# Patient Record
Sex: Female | Born: 1965 | Race: White | Hispanic: No | Marital: Married | Smoking: Former smoker
Health system: Southern US, Community
[De-identification: ages and names within clinical notes are randomized; demographics above are authoritative.]

## PROBLEM LIST (undated history)

## (undated) DIAGNOSIS — I10 Essential (primary) hypertension: Secondary | ICD-10-CM

---

## 2011-09-12 ENCOUNTER — Ambulatory Visit (INDEPENDENT_AMBULATORY_CARE_PROVIDER_SITE_OTHER): Payer: BC Managed Care – PPO | Admitting: Obstetrics & Gynecology

## 2011-09-12 ENCOUNTER — Encounter: Payer: Self-pay | Admitting: Obstetrics & Gynecology

## 2011-09-12 VITALS — BP 155/77 | HR 89 | Ht 64.0 in | Wt 136.0 lb

## 2011-09-12 DIAGNOSIS — Z124 Encounter for screening for malignant neoplasm of cervix: Secondary | ICD-10-CM

## 2011-09-12 DIAGNOSIS — Z0000 Encounter for general adult medical examination without abnormal findings: Secondary | ICD-10-CM

## 2011-09-12 DIAGNOSIS — Z803 Family history of malignant neoplasm of breast: Secondary | ICD-10-CM

## 2011-09-12 DIAGNOSIS — Z1151 Encounter for screening for human papillomavirus (HPV): Secondary | ICD-10-CM

## 2011-09-12 DIAGNOSIS — Z113 Encounter for screening for infections with a predominantly sexual mode of transmission: Secondary | ICD-10-CM

## 2011-09-12 DIAGNOSIS — Z01419 Encounter for gynecological examination (general) (routine) without abnormal findings: Secondary | ICD-10-CM

## 2011-09-12 NOTE — Patient Instructions (Signed)
Place premenopausal annual exam patient instructions here.  °

## 2011-09-12 NOTE — Progress Notes (Signed)
Yearly exam.  Has had Mirena in for 6 years now, she does need it removed and replaced, she is not currently sexually active.

## 2011-09-12 NOTE — Progress Notes (Signed)
  Subjective:     Teresa Lewis is a 46 y.o. female here for a routine exam.  Current complaints: none.  Personal health questionnaire reviewed: yes.   Gynecologic History Patient's last menstrual period was 08/22/2011. Contraception: IUD Last Pap: 2006. Results were: normal Last mammogram: never. Results were: n/a  Obstetric History OB History    Grav Para Term Preterm Abortions TAB SAB Ect Mult Living                   The following portions of the patient's history were reviewed and updated as appropriate: allergies, current medications, past family history, past medical history, past social history, past surgical history and problem list.  Review of Systems Pertinent items are noted in HPI.    Objective:    Vitals:  WNL General appearance: alert, cooperative and no distress Head: Normocephalic, without obvious abnormality, atraumatic Eyes: negative Throat: lips, mucosa, and tongue normal; teeth and gums normal Lungs: clear to auscultation bilaterally Breasts: normal appearance, no masses or tenderness, No nipple retraction or dimpling, No nipple discharge or bleeding Heart: regular rate and rhythm Abdomen: soft, non-tender; bowel sounds normal; no masses,  no organomegaly Pelvic: cervix normal in appearance, external genitalia normal, no adnexal masses or tenderness, no bladder tenderness, no cervical motion tenderness, perianal skin: no external genital warts noted, rectovaginal septum normal, urethra without abnormality or discharge, uterus normal size, shape, and consistency and vagina normal without discharge.  Uterus retroverted. Extremities: no edema, redness or tenderness in the calves or thighs Skin: no lesions or rash Lymph nodes: Axillary adenopathy: none       Assessment:    Healthy female exam.    Plan:    Education reviewed: safe sex/STD prevention, self breast exams and skin cancer screening. Contraception: IUD. Mammogram ordered. Follow up in: 2  weeks. IUD removed today.  Has been in 7- years.  Will come back in for IUD placement next week. PRev health care labs at next visit   Pt to ask mom about BRCA testing

## 2011-09-27 ENCOUNTER — Ambulatory Visit (INDEPENDENT_AMBULATORY_CARE_PROVIDER_SITE_OTHER): Payer: BC Managed Care – PPO | Admitting: Advanced Practice Midwife

## 2011-09-27 ENCOUNTER — Encounter: Payer: Self-pay | Admitting: Advanced Practice Midwife

## 2011-09-27 VITALS — BP 137/84 | HR 87 | Ht 64.0 in | Wt 136.0 lb

## 2011-09-27 DIAGNOSIS — Z13220 Encounter for screening for lipoid disorders: Secondary | ICD-10-CM

## 2011-09-27 DIAGNOSIS — Z01812 Encounter for preprocedural laboratory examination: Secondary | ICD-10-CM

## 2011-09-27 DIAGNOSIS — Z01419 Encounter for gynecological examination (general) (routine) without abnormal findings: Secondary | ICD-10-CM

## 2011-09-27 DIAGNOSIS — Z975 Presence of (intrauterine) contraceptive device: Secondary | ICD-10-CM

## 2011-09-27 DIAGNOSIS — Z30430 Encounter for insertion of intrauterine contraceptive device: Secondary | ICD-10-CM

## 2011-09-27 NOTE — Progress Notes (Signed)
Patient is here to have Mirena IUD inserted, she had one removed on 09/12/2011 due to the five year limit was up.

## 2011-09-28 DIAGNOSIS — Z975 Presence of (intrauterine) contraceptive device: Secondary | ICD-10-CM | POA: Insufficient documentation

## 2011-09-28 LAB — LIPID PANEL
HDL: 57 mg/dL (ref >39)
LDL Cholesterol: 112 mg/dL — ABNORMAL HIGH (ref 0–99)
Triglycerides: 62 mg/dL (ref <150)
VLDL: 12 mg/dL (ref 0–40)

## 2011-09-28 LAB — TSH: TSH: <0.008 u[IU]/mL — ABNORMAL LOW (ref 0.350–4.500)

## 2011-09-28 MED ORDER — LEVONORGESTREL 20 MCG/24HR IU IUD: 1.0000 | INTRAUTERINE_SYSTEM | Freq: Once | INTRAUTERINE | Status: DC

## 2011-09-28 NOTE — Progress Notes (Signed)
HPI: Teresa Lewis is a 46 y.o. year old [id number] female here for insertion of Mirena IUD. Had expired Mirena removed 09/12/11. No IC since.   Patient identified, informed consent performed, signed copy in chart, time out was performed.  Urine pregnancy test negative.  Bimanual exam reveled retroverted uterus. Speculum placed in the vagina.  Cervix visualized.  Cleaned with Betadine x 2.  Uterus sounded to 8cm.  Mirena IUD placed per manufacturer's recommendations.  Strings trimmed to 3 cm.   Patient given post procedure instructions and Mirena care card with expiration date.  Patient is asked to check IUD strings periodically and follow up in 4-6 weeks for IUD check.   [place], CNM 09/27/11 11:51

## 2011-09-28 NOTE — Patient Instructions (Addendum)
Intrauterine Device Insertion Care After Refer to this sheet in the next few weeks. These instructions provide you with information on caring for yourself after your procedure. Your caregiver may also give you more specific instructions. Your treatment has been planned according to current medical practices, but problems sometimes occur. Call your caregiver if you have any problems or questions after your procedure. HOME CARE INSTRUCTIONS   Only take over-the-counter or prescription medicines for pain, discomfort, or fever as directed by your caregiver. Do not use aspirin. This may increase bleeding.   Check your IUD to make sure it is in place before you resume sexual activity. You should be able to feel the strings. If you cannot feel the strings, something may be wrong. The IUD may have fallen out of the uterus, or the uterus may have been punctured (perforated) during placement. Also, if the strings are getting longer, it may mean that the IUD is being forced out of the uterus. You no longer have full protection from pregnancy if any of these problems occur.   You may resume sexual intercourse if you are not having problems with the IUD. The IUD is considered immediately effective.   You may resume normal activities.   Keep all follow-up appointments to be sure your IUD has remained in place. After the first exam, yearly exams are advised, unless you cannot feel the strings of your IUD.   Continue to check that the IUD is still in place by feeling for the strings after every menstrual period.  SEEK MEDICAL CARE IF:   You have bleeding that is heavier or lasts longer than a normal menstrual cycle.   You have a fever.   You have increasing cramps or abdominal pain not relieved with medicine.   You have abdominal pain that does not seem to be related to the same area of earlier cramping and pain.   You are lightheaded, unusually weak, or faint.   You have abnormal vaginal discharge or  smells.   You have pain during sexual intercourse.   You cannot feel the IUD strings, or the IUD string has gotten longer.   You feel the IUD at the opening of the cervix in the vagina.   You think you are pregnant, or you miss your menstrual period.   The IUD string is hurting your sex partner.  Document Released: 09/05/2010 Document Revised: 12/27/2010 Document Reviewed: 09/05/2010 Exit[employer] Patient Information 2012 [employer]. 

## 2011-10-01 ENCOUNTER — Other Ambulatory Visit: Payer: Self-pay | Admitting: Obstetrics & Gynecology

## 2011-10-01 ENCOUNTER — Ambulatory Visit (INDEPENDENT_AMBULATORY_CARE_PROVIDER_SITE_OTHER): Payer: BC Managed Care – PPO

## 2011-10-01 DIAGNOSIS — Z01419 Encounter for gynecological examination (general) (routine) without abnormal findings: Secondary | ICD-10-CM

## 2011-10-01 DIAGNOSIS — Z1231 Encounter for screening mammogram for malignant neoplasm of breast: Secondary | ICD-10-CM

## 2011-10-01 DIAGNOSIS — R928 Other abnormal and inconclusive findings on diagnostic imaging of breast: Secondary | ICD-10-CM

## 2011-10-01 DIAGNOSIS — Z803 Family history of malignant neoplasm of breast: Secondary | ICD-10-CM

## 2011-10-02 LAB — CBC WITH DIFFERENTIAL/PLATELET
Basophils Absolute: 0 10*3/uL (ref 0.0–0.1)
Basophils Relative: 1 % (ref 0–1)
Lymphocytes Relative: 32 % (ref 12–46)
MCHC: 32.9 g/dL (ref 30.0–36.0)
Neutro Abs: 3.6 10*3/uL (ref 1.7–7.7)
Platelets: 263 10*3/uL (ref 150–400)
RDW: 12.6 % (ref 11.5–15.5)
WBC: 6 10*3/uL (ref 4.0–10.5)

## 2011-10-04 ENCOUNTER — Other Ambulatory Visit: Payer: Self-pay | Admitting: Obstetrics & Gynecology

## 2011-10-04 DIAGNOSIS — R928 Other abnormal and inconclusive findings on diagnostic imaging of breast: Secondary | ICD-10-CM

## 2011-10-08 ENCOUNTER — Ambulatory Visit
Admission: RE | Admit: 2011-10-08 | Discharge: 2011-10-08 | Disposition: A | Discharge status: Home / Self Care | Payer: BC Managed Care – PPO | Source: Ambulatory Visit | Attending: Obstetrics & Gynecology | Admitting: Obstetrics & Gynecology

## 2011-10-08 DIAGNOSIS — R928 Other abnormal and inconclusive findings on diagnostic imaging of breast: Secondary | ICD-10-CM

## 2011-10-10 ENCOUNTER — Encounter: Payer: Self-pay | Admitting: Obstetrics & Gynecology

## 2011-10-16 ENCOUNTER — Other Ambulatory Visit: Payer: Self-pay | Admitting: Obstetrics & Gynecology

## 2011-10-16 DIAGNOSIS — E038 Other specified hypothyroidism: Secondary | ICD-10-CM

## 2011-10-18 ENCOUNTER — Other Ambulatory Visit (INDEPENDENT_AMBULATORY_CARE_PROVIDER_SITE_OTHER): Payer: BC Managed Care – PPO | Admitting: *Deleted

## 2011-10-18 DIAGNOSIS — E0590 Thyrotoxicosis, unspecified without thyrotoxic crisis or storm: Secondary | ICD-10-CM

## 2011-10-21 ENCOUNTER — Telehealth: Payer: Self-pay | Admitting: *Deleted

## 2011-10-21 NOTE — Telephone Encounter (Signed)
Pt referred to Dr Warden Fillers, endocrinologist for 11/22/11 @ 8:40 am.  Office phone is [phone].  Records faxed to his office.  Pt notified of appt

## 2011-11-01 ENCOUNTER — Ambulatory Visit: Payer: BC Managed Care – PPO | Admitting: Family

## 2011-11-01 DIAGNOSIS — Z09 Encounter for follow-up examination after completed treatment for conditions other than malignant neoplasm: Secondary | ICD-10-CM

## 2012-03-16 ENCOUNTER — Other Ambulatory Visit: Payer: Self-pay | Admitting: Obstetrics & Gynecology

## 2012-03-16 DIAGNOSIS — N6489 Other specified disorders of breast: Secondary | ICD-10-CM

## 2012-04-01 ENCOUNTER — Other Ambulatory Visit: Payer: Self-pay | Admitting: Obstetrics & Gynecology

## 2012-04-01 ENCOUNTER — Ambulatory Visit
Admission: RE | Admit: 2012-04-01 | Discharge: 2012-04-01 | Disposition: A | Discharge status: Home / Self Care | Payer: BC Managed Care – PPO | Source: Ambulatory Visit | Attending: Obstetrics & Gynecology | Admitting: Obstetrics & Gynecology

## 2012-04-01 DIAGNOSIS — N6489 Other specified disorders of breast: Secondary | ICD-10-CM

## 2012-04-01 HISTORY — PX: BREAST BIOPSY: SHX20

## 2012-04-02 ENCOUNTER — Encounter: Payer: Self-pay | Admitting: Obstetrics & Gynecology

## 2012-04-16 ENCOUNTER — Encounter: Payer: Self-pay | Admitting: Obstetrics & Gynecology

## 2013-02-08 ENCOUNTER — Other Ambulatory Visit: Payer: Self-pay

## 2013-02-08 ENCOUNTER — Other Ambulatory Visit: Payer: Self-pay | Admitting: Obstetrics & Gynecology

## 2013-02-08 DIAGNOSIS — N63 Unspecified lump in unspecified breast: Secondary | ICD-10-CM

## 2013-02-19 ENCOUNTER — Ambulatory Visit
Admission: RE | Admit: 2013-02-19 | Discharge: 2013-02-19 | Disposition: A | Discharge status: Home / Self Care | Payer: BC Managed Care – PPO | Source: Ambulatory Visit | Attending: Obstetrics & Gynecology | Admitting: Obstetrics & Gynecology

## 2013-02-19 DIAGNOSIS — N63 Unspecified lump in unspecified breast: Secondary | ICD-10-CM

## 2013-03-24 DIAGNOSIS — I10 Essential (primary) hypertension: Secondary | ICD-10-CM | POA: Insufficient documentation

## 2013-06-22 ENCOUNTER — Other Ambulatory Visit: Payer: Self-pay

## 2013-06-22 DIAGNOSIS — Z1231 Encounter for screening mammogram for malignant neoplasm of breast: Secondary | ICD-10-CM

## 2013-07-02 ENCOUNTER — Encounter (INDEPENDENT_AMBULATORY_CARE_PROVIDER_SITE_OTHER): Payer: Self-pay

## 2013-07-02 ENCOUNTER — Ambulatory Visit
Admission: RE | Admit: 2013-07-02 | Discharge: 2013-07-02 | Disposition: A | Discharge status: Home / Self Care | Payer: BC Managed Care – PPO | Source: Ambulatory Visit

## 2013-07-02 DIAGNOSIS — Z1231 Encounter for screening mammogram for malignant neoplasm of breast: Secondary | ICD-10-CM

## 2013-11-22 ENCOUNTER — Encounter: Payer: Self-pay | Admitting: Advanced Practice Midwife

## 2014-04-28 ENCOUNTER — Emergency Department (INDEPENDENT_AMBULATORY_CARE_PROVIDER_SITE_OTHER)
Admission: EM | Admit: 2014-04-28 | Discharge: 2014-04-28 | Disposition: A | Discharge status: Home / Self Care | Payer: BLUE CROSS/BLUE SHIELD | Source: Home / Self Care | Attending: Emergency Medicine | Admitting: Emergency Medicine

## 2014-04-28 ENCOUNTER — Encounter: Payer: Self-pay | Admitting: Emergency Medicine

## 2014-04-28 DIAGNOSIS — J1189 Influenza due to unidentified influenza virus with other manifestations: Secondary | ICD-10-CM | POA: Diagnosis not present

## 2014-04-28 DIAGNOSIS — J029 Acute pharyngitis, unspecified: Secondary | ICD-10-CM

## 2014-04-28 DIAGNOSIS — J111 Influenza due to unidentified influenza virus with other respiratory manifestations: Secondary | ICD-10-CM

## 2014-04-28 LAB — POCT RAPID STREP A (OFFICE): Rapid Strep A Screen: NEGATIVE

## 2014-04-28 MED ORDER — OSELTAMIVIR PHOSPHATE 75 MG PO CAPS: ORAL_CAPSULE | ORAL | Status: DC

## 2014-04-28 MED ORDER — BENZONATATE 200 MG PO CAPS: ORAL_CAPSULE | ORAL | Status: DC

## 2014-04-28 NOTE — ED Notes (Signed)
Sore throat, fever, cough, watery eyes runny nose, body aches, chills x 3 days

## 2014-04-29 LAB — STREP A DNA PROBE: GASP: NEGATIVE

## 2014-04-29 NOTE — ED Provider Notes (Signed)
CSN: [id number]     Arrival date & time 04/28/14  1140 History   First MD Initiated Contact with Patient 04/28/14 1148     Chief Complaint  Patient presents with  . Sore Throat   (Consider location/radiation/quality/duration/timing/severity/associated sxs/prior Treatment) HPI Sore throat, fever, cough, watery eyes runny nose, body aches, chills x 2 days FLU  HPI :  Fever to 102 with chills, sweats, myalgias, fatigue, headache. Symptoms are progressively worsening, despite trying OTC fever reducing medicine and rest and fluids. Has decreased appetite, but tolerating some liquids by mouth. No history of recent tick bite.  Review of Systems: Positive for fatigue, mild nasal congestion,  sore throat, mild swollen anterior neck glands, mild cough. Negative for acute vision changes, stiff neck, focal weakness, syncope, seizures, respiratory distress, vomiting, diarrhea, GU symptoms, new Rash.  Remainder of Review of Systems negative for acute change except as noted in the HPI.  History reviewed. No pertinent past medical history. History reviewed. No pertinent past surgical history. Family History  Problem Relation Age of Onset  . Cancer Mother [id number]    Breast Cancer  . Cancer Father     leukemia  . Diabetes Father   . Hypertension Father   . Clotting disorder Father   . Drug abuse Maternal Grandmother   . Drug abuse Maternal Grandfather   . Drug abuse Paternal Grandmother   . Drug abuse Paternal Grandfather    History  Substance Use Topics  . Smoking status: Former Games developermoker  . Smokeless tobacco: Never Used  . Alcohol Use: No   OB History    Gravida Para Term Preterm AB TAB SAB Ectopic Multiple Living   2 2 2       2      Review of Systems  Allergies  Review of patient's allergies indicates not on file.  Home Medications   Prior to Admission medications   Medication Sig Start Date End Date Taking? Authorizing Provider  guaiFENesin (MUCINEX) 600 MG 12 hr tablet Take by  mouth 2 (two) times daily.   Yes Historical Provider, MD  ibuprofen (ADVIL,MOTRIN) 200 MG tablet Take 200 mg by mouth every 6 (six) hours as needed.   Yes Historical Provider, MD  Pseudoephedrine-APAP-DM (DAYQUIL PO) Take by mouth.   Yes Historical Provider, MD  benzonatate (TESSALON) 200 MG capsule Take 1 every 8 hours as needed for cough. 04/28/14   Lajean Manesavid Massey, MD  levonorgestrel (MIRENA) 20 MCG/24HR IUD 1 Intra Uterine Device (1 each total) by Intrauterine route once. 09/28/11 09/27/12  Dorathy KinsmanVirginia Smith, CNM  oseltamivir (TAMIFLU) 75 MG capsule Starting today, take 1 capsule by mouth twice a day for 5 days. 04/28/14   Lajean Manesavid Massey, MD   BP 159/92 mmHg  Pulse 90  Temp(Src) 99.2 F (37.3 C) (Oral)  Ht 5\' 4"  (1.626 m)  Wt 143 lb (64.864 kg)  BMI 24.53 kg/m2  SpO2 97% Physical Exam  Constitutional: She is oriented to person, place, and time. She appears well-developed and well-nourished. She is cooperative.  Non-toxic appearance. No distress.  HENT:  Head: Normocephalic and atraumatic.  Right Ear: Tympanic membrane, external ear and ear canal normal.  Left Ear: Tympanic membrane, external ear and ear canal normal.  Nose: Nose normal. Right sinus exhibits no maxillary sinus tenderness and no frontal sinus tenderness. Left sinus exhibits no maxillary sinus tenderness and no frontal sinus tenderness.  Mouth/Throat: Mucous membranes are normal. Posterior oropharyngeal erythema present. No oropharyngeal exudate or posterior oropharyngeal edema.  Eyes: Conjunctivae are normal. No  scleral icterus.  Neck: Neck supple.  Cardiovascular: Normal rate, regular rhythm and normal heart sounds.   No murmur heard. Pulmonary/Chest: Effort normal and breath sounds normal. No stridor. No respiratory distress. She has no wheezes. She has no rales.  Musculoskeletal: She exhibits no edema.  Lymphadenopathy:    She has cervical adenopathy.       Right cervical: Superficial cervical adenopathy present. No deep cervical  and no posterior cervical adenopathy present.      Left cervical: Superficial cervical adenopathy present. No deep cervical and no posterior cervical adenopathy present.  Neurological: She is alert and oriented to person, place, and time.  Skin: Skin is warm and dry.  Psychiatric: She has a normal mood and affect.  Nursing note and vitals reviewed.   ED Course  Procedures (including critical care time) Labs Review Labs Reviewed  STREP A DNA PROBE   Narrative:    Performed at:  [facility]                [address]                [place], [place] [address]  POCT RAPID STREP A (OFFICE)   Results for orders placed or performed during the hospital encounter of 04/28/14  Strep A DNA probe  Result Value Ref Range   GASP NEGATIVE   POCT rapid strep A  Result Value Ref Range   Rapid Strep A Screen Negative Negative     Imaging Review No results found.   MDM   1. Influenza with respiratory manifestations   2. Acute pharyngitis, unspecified pharyngitis type    Treatment options discussed, as well as risks, benefits, alternatives. Patient voiced understanding and agreement with the following plans: Discharge Medication List as of 04/28/2014 12:40 PM    START taking these medications   Details  benzonatate (TESSALON) 200 MG capsule Take 1 every 8 hours as needed for cough., Normal    oseltamivir (TAMIFLU) 75 MG capsule Starting today, take 1 capsule by mouth twice a day for 5 days., Normal       other symptomatic care discussed Follow-up with your primary care doctor in 5-7 days if not improving, or sooner if symptoms become worse. Precautions discussed. Red flags discussed. Questions invited and answered. Patient voiced understanding and agreement.     Lajean Manes, MD 04/29/14 [id number]

## 2014-05-31 ENCOUNTER — Other Ambulatory Visit: Payer: Self-pay | Admitting: Obstetrics & Gynecology

## 2014-05-31 DIAGNOSIS — Z1231 Encounter for screening mammogram for malignant neoplasm of breast: Secondary | ICD-10-CM

## 2014-07-06 ENCOUNTER — Ambulatory Visit: Payer: BLUE CROSS/BLUE SHIELD

## 2015-02-10 ENCOUNTER — Ambulatory Visit: Payer: BLUE CROSS/BLUE SHIELD

## 2015-02-21 ENCOUNTER — Ambulatory Visit
Admission: RE | Admit: 2015-02-21 | Discharge: 2015-02-21 | Disposition: A | Discharge status: Home / Self Care | Payer: BLUE CROSS/BLUE SHIELD | Source: Ambulatory Visit | Attending: Obstetrics & Gynecology | Admitting: Obstetrics & Gynecology

## 2015-02-21 DIAGNOSIS — Z1231 Encounter for screening mammogram for malignant neoplasm of breast: Secondary | ICD-10-CM

## 2015-04-03 ENCOUNTER — Ambulatory Visit (INDEPENDENT_AMBULATORY_CARE_PROVIDER_SITE_OTHER): Payer: BLUE CROSS/BLUE SHIELD | Admitting: Obstetrics & Gynecology

## 2015-04-03 ENCOUNTER — Encounter: Payer: Self-pay | Admitting: Obstetrics & Gynecology

## 2015-04-03 VITALS — BP 144/81 | HR 78 | Resp 16 | Ht 64.0 in | Wt 137.0 lb

## 2015-04-03 DIAGNOSIS — N63 Unspecified lump in unspecified breast: Secondary | ICD-10-CM | POA: Insufficient documentation

## 2015-04-03 DIAGNOSIS — Z124 Encounter for screening for malignant neoplasm of cervix: Secondary | ICD-10-CM

## 2015-04-03 DIAGNOSIS — N644 Mastodynia: Secondary | ICD-10-CM | POA: Diagnosis not present

## 2015-04-03 DIAGNOSIS — Z1151 Encounter for screening for human papillomavirus (HPV): Secondary | ICD-10-CM

## 2015-04-03 DIAGNOSIS — Z01419 Encounter for gynecological examination (general) (routine) without abnormal findings: Secondary | ICD-10-CM | POA: Diagnosis not present

## 2015-04-03 DIAGNOSIS — N6012 Diffuse cystic mastopathy of left breast: Secondary | ICD-10-CM | POA: Diagnosis not present

## 2015-04-03 DIAGNOSIS — Z803 Family history of malignant neoplasm of breast: Secondary | ICD-10-CM

## 2015-04-03 NOTE — Addendum Note (Signed)
Addended by: Granville LewisLARK, LORA L on: 04/03/2015 04:32 PM   Modules accepted: Orders

## 2015-04-03 NOTE — Progress Notes (Signed)
  Subjective:     Teresa Lewis is a 50 y.o. female here for a routine exam.  Current complaints: left breast pain that starts at 3'0clock and shoots laterally.  Occurred for past 6 months.  Had nml mammogram 2 months ago.  Wants BRCA testing (my risk).  Amenorrhea with IUD and happy..   Gynecologic History No LMP recorded. Patient is not currently having periods (Reason: IUD). Contraception: IUD Last Pap: 204. Results were: normal Last mammogram: 2017. Results were: normal  Obstetric History OB History  Gravida Para Term Preterm AB SAB TAB Ectopic Multiple Living  '2 2 2       2    '$ # Outcome Date GA Lbr Len/2nd Weight Sex Delivery Anes PTL Lv  2 Term      Vag-Spont     1 Term      Vag-Spont          The following portions of the patient's history were reviewed and updated as appropriate: allergies, current medications, past family history, past medical history, past social history, past surgical history and problem list.  Review of Systems Pertinent items noted in HPI and remainder of comprehensive ROS otherwise negative.    Objective:      Filed Vitals:   04/03/15 1418  BP: 144/81  Pulse: 78  Resp: 16  Height: '5\' 4"'$  (1.626 m)  Weight: 137 lb (62.143 kg)   Vitals:  WNL General appearance: alert, cooperative and no distress  HEENT: Normocephalic, without obvious abnormality, atraumatic Eyes: negative Throat: lips, mucosa, and tongue normal; teeth and gums normal  Respiratory: Clear to auscultation bilaterally  CV: Regular rate and rhythm  Breasts:  Normal appearance, no masses or tenderness, no nipple retraction or dimpling; pain just lateral to nipple at 3 o'clock, no mass,   GI: Soft, non-tender; bowel sounds normal; no masses,  no organomegaly  GU: External Genitalia:  Tanner V, no lesion Urethra:  No prolapse   Vagina: Pink, normal rugae, no blood or discharge  Cervix: No CMT, no lesion  Uterus:  Normal size and contour, non tender  Adnexa: Normal, no  masses, non tender  Musculoskeletal: No edema, redness or tenderness in the calves or thighs  Skin: No lesions or rash  Lymphatic: Axillary adenopathy: none    Psychiatric: Normal mood and behavior          Assessment:    Healthy female exam.   Left breast pain x 6 months Mother and other relative with breast cancer   Plan:    Contraception: IUD. Mammogram ordered. Follow up in: 1 year. pap  My risk Colonoscopy at 65 IUD removal in 2018; wants reinsertion.

## 2015-04-05 LAB — CYTOLOGY - PAP

## 2015-04-07 ENCOUNTER — Encounter: Payer: Self-pay | Admitting: Obstetrics & Gynecology

## 2015-04-07 ENCOUNTER — Telehealth: Payer: Self-pay | Admitting: *Deleted

## 2015-04-07 DIAGNOSIS — IMO0002 Reserved for concepts with insufficient information to code with codable children: Secondary | ICD-10-CM | POA: Insufficient documentation

## 2015-04-07 NOTE — Telephone Encounter (Signed)
Pt notified of abnormal pap results and appt made with Dr Penne LashLeggett for Colposcopy

## 2015-04-07 NOTE — Telephone Encounter (Signed)
-----   Message from Teresa DukesKelly H Leggett, MD sent at 04/07/2015  4:30 AM EDT ----- ASCUS with HPV positive.  Pt needs to have colposcopy scheduled.   Remind pt to sign up for my chart

## 2015-04-10 ENCOUNTER — Ambulatory Visit
Admission: RE | Admit: 2015-04-10 | Discharge: 2015-04-10 | Disposition: A | Discharge status: Home / Self Care | Payer: BLUE CROSS/BLUE SHIELD | Source: Ambulatory Visit | Attending: Obstetrics & Gynecology | Admitting: Obstetrics & Gynecology

## 2015-04-10 DIAGNOSIS — N644 Mastodynia: Secondary | ICD-10-CM

## 2015-04-12 ENCOUNTER — Ambulatory Visit (INDEPENDENT_AMBULATORY_CARE_PROVIDER_SITE_OTHER): Payer: BLUE CROSS/BLUE SHIELD | Admitting: Obstetrics & Gynecology

## 2015-04-12 ENCOUNTER — Encounter: Payer: Self-pay | Admitting: Obstetrics & Gynecology

## 2015-04-12 VITALS — BP 122/66 | HR 92 | Resp 16 | Ht 64.0 in | Wt 138.0 lb

## 2015-04-12 DIAGNOSIS — R87810 Cervical high risk human papillomavirus (HPV) DNA test positive: Secondary | ICD-10-CM

## 2015-04-12 DIAGNOSIS — R87610 Atypical squamous cells of undetermined significance on cytologic smear of cervix (ASC-US): Secondary | ICD-10-CM

## 2015-04-12 DIAGNOSIS — Z01812 Encounter for preprocedural laboratory examination: Secondary | ICD-10-CM

## 2015-04-12 DIAGNOSIS — IMO0002 Reserved for concepts with insufficient information to code with codable children: Secondary | ICD-10-CM

## 2015-04-12 LAB — POCT URINE PREGNANCY: Preg Test, Ur: NEGATIVE

## 2015-04-12 NOTE — Progress Notes (Signed)
  Colposcopy Procedure Note  Indications: Pap smear 2 weeks ago showed: ASCUS with POSITIVE high risk HPV. The prior pap showed ASCUS with POSITIVE high risk HPV.  Prior cervical/vaginal disease: none. Prior cervical treatment: no treatment.  Pregnancy test negative.   IUD strings present at end of procedure.  Procedure Details  The risks and benefits of the procedure and Written informed consent obtained.  Speculum placed in vagina and excellent visualization of cervix achieved, cervix swabbed x 3 with acetic acid solution.  Findings: Cervix: acetowhite lesion(s) noted at 9 o'clock just inside cervical os; cervix swabbed with Lugol's solution, endocervical speculum placed, endocervical lesion noted at 9 o'clock, endocervical curettage performed, cervical biopsies taken at 9 o'clock, specimen labelled and sent to pathology and hemostasis achieved with Monsel's solution. Vaginal inspection: vaginal colposcopy not performed. Vulvar colposcopy: vulvar colposcopy not performed.  Specimens: Biopsy at 9 o'clock and ECC  Complications: none.  Plan: Specimens labelled and sent to Pathology. Will base further treatment on Pathology findings.

## 2015-04-13 ENCOUNTER — Encounter: Payer: Self-pay | Admitting: *Deleted

## 2015-04-13 ENCOUNTER — Telehealth: Payer: Self-pay | Admitting: *Deleted

## 2015-04-13 NOTE — Telephone Encounter (Signed)
Copy of My Risk results mailed to pt and copy scanned into chart

## 2015-04-17 ENCOUNTER — Telehealth: Payer: Self-pay | Admitting: *Deleted

## 2015-04-17 NOTE — Telephone Encounter (Signed)
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 RN to call patient

## 2015-04-17 NOTE — Telephone Encounter (Signed)
Pt notified via phone of bx results and will recheck in 1 year per Dr Penne LashLeggett.

## 2015-05-04 ENCOUNTER — Telehealth: Payer: Self-pay | Admitting: *Deleted

## 2015-05-11 NOTE — Telephone Encounter (Signed)
See above note

## 2015-05-15 ENCOUNTER — Telehealth: Payer: Self-pay | Admitting: *Deleted

## 2015-05-15 NOTE — Telephone Encounter (Signed)
-----  Message from Guss Bunde, MD sent at 04/25/2015  1:43 PM EDT ----- Can you check with her and see if she called so I can document her result. ----- Message -----    From: Asencion Islam, RN    Sent: 04/25/2015  10:51 AM      To: Guss Bunde, MD  Yes, There is a number on bottom of report that pt can call.  i believe I let pt know that. ----- Message -----    From: Guss Bunde, MD    Sent: 04/25/2015  10:30 AM      To: Asencion Islam, RN  Does [facility] do counseling over the phone for these variants (my risk)

## 2015-05-15 NOTE — Telephone Encounter (Signed)
LM on voicmeail to call office as a f/u on My Risk results.  Looking to see if pt called [facility] to speak with a genetic counselor concerning the variant that was noted on her results.  LM for patient to call with updates on that information.

## 2016-01-01 ENCOUNTER — Ambulatory Visit (INDEPENDENT_AMBULATORY_CARE_PROVIDER_SITE_OTHER): Payer: BLUE CROSS/BLUE SHIELD | Admitting: Obstetrics & Gynecology

## 2016-01-01 ENCOUNTER — Encounter: Payer: Self-pay | Admitting: Obstetrics & Gynecology

## 2016-01-01 VITALS — BP 141/73 | HR 81 | Ht 64.0 in | Wt 155.0 lb

## 2016-01-01 DIAGNOSIS — B9689 Other specified bacterial agents as the cause of diseases classified elsewhere: Secondary | ICD-10-CM

## 2016-01-01 DIAGNOSIS — N760 Acute vaginitis: Secondary | ICD-10-CM

## 2016-01-01 MED ORDER — METRONIDAZOLE 500 MG PO TABS: 500.0000 mg | ORAL_TABLET | Freq: Two times a day (BID) | ORAL | 0 refills | Status: DC

## 2016-01-01 NOTE — Progress Notes (Signed)
   Subjective:    Patient ID: Teresa Lewis, female    DOB: 12/23/1965, 50 y.o.   MRN: [id number]  HPI 50 yo engaged lady here with the complaint of a month's h/o post coital spotting. She also reports an occasional "abscess" of her perineum but doesn't appreciate it today.   Review of Systems She doesn't believe that testing for CT/GC is necessary.    Objective:   Physical Exam [id number] Breathing, conversing, and ambulating normally Vaginal discharge c/w BV IUD strings seen No abnormalities seen of perineum       Assessment & Plan:  Probable BV- wet prep sent, treat with flagyl I suspect that the PCB is due to the age of her Mirena (4 years). Reassurance given

## 2016-01-02 ENCOUNTER — Telehealth: Payer: Self-pay | Admitting: *Deleted

## 2016-01-02 LAB — WET PREP FOR TRICH, YEAST, CLUE
Trich, Wet Prep: NONE SEEN
Yeast Wet Prep HPF POC: NONE SEEN

## 2016-01-02 NOTE — Telephone Encounter (Signed)
Pt notified of BV on wet prep.  Dr Marice Potterove treat patient with Flagyl

## 2016-01-10 ENCOUNTER — Other Ambulatory Visit: Payer: Self-pay | Admitting: Obstetrics & Gynecology

## 2016-01-10 DIAGNOSIS — B9689 Other specified bacterial agents as the cause of diseases classified elsewhere: Secondary | ICD-10-CM

## 2016-01-10 DIAGNOSIS — N760 Acute vaginitis: Principal | ICD-10-CM

## 2016-01-17 ENCOUNTER — Telehealth: Payer: Self-pay | Admitting: *Deleted

## 2016-01-17 DIAGNOSIS — N760 Acute vaginitis: Principal | ICD-10-CM

## 2016-01-17 DIAGNOSIS — B9689 Other specified bacterial agents as the cause of diseases classified elsewhere: Secondary | ICD-10-CM

## 2016-01-17 MED ORDER — CLINDAMYCIN PHOSPHATE (1 DOSE) 2 % VA CREA: TOPICAL_CREAM | VAGINAL | 1 refills | Status: DC

## 2016-01-17 NOTE — Telephone Encounter (Signed)
Pt called stating that she has finished her Flagyl yet doesn't feel like it has cleared up.  RX switched to Clindamycin cream

## 2016-02-19 ENCOUNTER — Other Ambulatory Visit: Payer: Self-pay | Admitting: Obstetrics & Gynecology

## 2016-02-19 DIAGNOSIS — Z1231 Encounter for screening mammogram for malignant neoplasm of breast: Secondary | ICD-10-CM

## 2016-02-29 ENCOUNTER — Telehealth: Payer: Self-pay | Admitting: *Deleted

## 2016-02-29 DIAGNOSIS — N760 Acute vaginitis: Principal | ICD-10-CM

## 2016-02-29 DIAGNOSIS — B9689 Other specified bacterial agents as the cause of diseases classified elsewhere: Secondary | ICD-10-CM

## 2016-02-29 MED ORDER — METRONIDAZOLE 500 MG PO TABS: 500.0000 mg | ORAL_TABLET | Freq: Two times a day (BID) | ORAL | 0 refills | Status: DC

## 2016-02-29 MED ORDER — NONFORMULARY OR COMPOUNDED ITEM: 1.0000 | Freq: Every day | 0 refills | Status: DC

## 2016-02-29 NOTE — Telephone Encounter (Signed)
Pt called stating that her feels like her BV is back.  Spoke with Dr Marice Potterove who recommended a RF on Falgyl and start Boric Acid capsules for 21 days after she finishes the Flagyl.  [facility]

## 2016-03-04 ENCOUNTER — Ambulatory Visit: Payer: BLUE CROSS/BLUE SHIELD

## 2016-03-06 ENCOUNTER — Ambulatory Visit
Admission: RE | Admit: 2016-03-06 | Discharge: 2016-03-06 | Disposition: A | Discharge status: Home / Self Care | Payer: BLUE CROSS/BLUE SHIELD | Source: Ambulatory Visit | Attending: Obstetrics & Gynecology | Admitting: Obstetrics & Gynecology

## 2016-03-06 DIAGNOSIS — Z1231 Encounter for screening mammogram for malignant neoplasm of breast: Secondary | ICD-10-CM

## 2016-04-01 ENCOUNTER — Ambulatory Visit: Payer: BLUE CROSS/BLUE SHIELD | Admitting: Obstetrics & Gynecology

## 2016-04-10 ENCOUNTER — Encounter: Payer: Self-pay | Admitting: Obstetrics & Gynecology

## 2016-04-10 ENCOUNTER — Ambulatory Visit (INDEPENDENT_AMBULATORY_CARE_PROVIDER_SITE_OTHER): Payer: BLUE CROSS/BLUE SHIELD | Admitting: Obstetrics & Gynecology

## 2016-04-10 VITALS — BP 136/76 | HR 96 | Ht 64.0 in | Wt 155.0 lb

## 2016-04-10 DIAGNOSIS — Z0000 Encounter for general adult medical examination without abnormal findings: Secondary | ICD-10-CM

## 2016-04-10 DIAGNOSIS — Z30433 Encounter for removal and reinsertion of intrauterine contraceptive device: Secondary | ICD-10-CM

## 2016-04-10 DIAGNOSIS — Z01419 Encounter for gynecological examination (general) (routine) without abnormal findings: Secondary | ICD-10-CM

## 2016-04-10 DIAGNOSIS — Z01812 Encounter for preprocedural laboratory examination: Secondary | ICD-10-CM

## 2016-04-10 DIAGNOSIS — R350 Frequency of micturition: Secondary | ICD-10-CM

## 2016-04-10 LAB — LIPID PANEL
CHOL/HDL RATIO: 2.5 ratio (ref <5.0)
CHOLESTEROL: 209 mg/dL — AB (ref <200)
HDL: 82 mg/dL (ref >50)
LDL Cholesterol: 118 mg/dL — ABNORMAL HIGH (ref <100)
Triglycerides: 43 mg/dL (ref <150)
VLDL: 9 mg/dL (ref <30)

## 2016-04-10 LAB — COMPREHENSIVE METABOLIC PANEL
ALBUMIN: 4.5 g/dL (ref 3.6–5.1)
ALT: 21 U/L (ref 6–29)
AST: 19 U/L (ref 10–35)
Alkaline Phosphatase: 52 U/L (ref 33–115)
BILIRUBIN TOTAL: 0.5 mg/dL (ref 0.2–1.2)
BUN: 20 mg/dL (ref 7–25)
CO2: 29 mmol/L (ref 20–31)
CREATININE: 0.86 mg/dL (ref 0.50–1.10)
Calcium: 9.6 mg/dL (ref 8.6–10.2)
Chloride: 101 mmol/L (ref 98–110)
Glucose, Bld: 96 mg/dL (ref 65–99)
Potassium: 4 mmol/L (ref 3.5–5.3)
Sodium: 138 mmol/L (ref 135–146)
Total Protein: 6.6 g/dL (ref 6.1–8.1)

## 2016-04-10 LAB — CBC
HCT: 45.1 % — ABNORMAL HIGH (ref 35.0–45.0)
Hemoglobin: 15.2 g/dL (ref 11.7–15.5)
MCH: 33.6 pg — ABNORMAL HIGH (ref 27.0–33.0)
MCHC: 33.7 g/dL (ref 32.0–36.0)
MCV: 99.6 fL (ref 80.0–100.0)
MPV: 9.4 fL (ref 7.5–12.5)
PLATELETS: 325 10*3/uL (ref 140–400)
RBC: 4.53 MIL/uL (ref 3.80–5.10)
RDW: 12.7 % (ref 11.0–15.0)
WBC: 6.1 10*3/uL (ref 3.8–10.8)

## 2016-04-10 LAB — FOLLICLE STIMULATING HORMONE: FSH: 100.7 m[IU]/mL

## 2016-04-10 LAB — TSH: TSH: 2.56 m[IU]/L

## 2016-04-10 MED ORDER — LEVONORGESTREL 20 MCG/24HR IU IUD: INTRAUTERINE_SYSTEM | Freq: Once | INTRAUTERINE | Status: DC

## 2016-04-10 NOTE — Progress Notes (Signed)
Progress Notes      Subjective:     Teresa Lewis is a 51 y.o. female here for a routine exam with pap.  Current complaints: "straining sensation with urination and feels that her infection is back." Had nml mammogram 02/2016, marker was placed last year to monitor a pain she was experiencing. had BRCA testing (my risk), came back with variant uncertainty significance.  Amenorrhea with IUD,would like it replaced.    Gynecologic History No LMP recorded. Patient is not currently having periods (Reason: IUD). Contraception: IUD Last Pap: 12/17. Results were: positive for BV on wet prep, treatment with Flagyl, Clindamycin cream and boric acid capsules.  Last mammogram: 2018. Results were: normal  Obstetric History                      OB History  Gravida Para Term Preterm AB SAB TAB Ectopic Multiple Living  '2 2 2       2    '$ # Outcome Date GA Lbr Len/2nd Weight Sex Delivery Anes PTL Lv  2 Term      Vag-Spont     1 Term      Vag-Spont          The following portions of the patient's history were reviewed and updated as appropriate: allergies, current medications, past family history, past medical history, past social history, past surgical history and problem list.  Review of Systems Pertinent items noted in HPI and remainder of comprehensive ROS otherwise negative.    Objective:      Filed Vitals:   04/10/16 9:01  BP: 136/76  Pulse: 96  Resp: 16  Height: '5\' 4"'$  (1.626 m)  Weight: 155 lb (62.143 kg)   Vitals:  WNL General appearance: alert, cooperative and no distress  HEENT: Normocephalic, without obvious abnormality, atraumatic Eyes: negative Throat: lips, mucosa, and tongue normal; teeth and gums normal  Respiratory: Clear to auscultation bilaterally  CV: Regular rate and rhythm  Breasts:  Normal appearance, no masses or tenderness, no nipple retraction or dimpling, or pain. Scar noted at 2 o'clock from mammogram marker placement  in 2017.   GI: Soft, non-tender; bowel sounds normal; no masses,  no organomegaly  GU: External Genitalia:  Tanner V, no lesion Urethra:  No prolapse   Vagina: Pink, normal rugae, no blood or discharge  Cervix: No CMT, no lesion, IUD strings present  Uterus:  Normal size and contour, non tender  Adnexa: Normal, no masses, non tender  Musculoskeletal: No edema, redness or tenderness in the calves or thighs  Skin: No lesions or rash  Lymphatic: Axillary adenopathy: none    Psychiatric: Normal mood and behavior          Assessment:  Healthy female exam. Pap performed, pt concern for BV return Aptima sent for vaginitis.  IUD in place with strings visualized on pap exam. Pt desires STD testing.  Cytology-Pap  Labs: CMP Hepatitis B surface antigen Hepatitis C antibody HIV antibody with reflex Lipid Panel RPR Vitamin D TSH FSH Urine pregnancy    Plan:   Contraception: IUD. Follow up in: 1 month. Pap results will be called to the patient and placed in my chart. Advise patient to set up my chart. So, that she can see results and contact office, as need.  My risk-advised to contact the company to have further genetic counseling about variant uncertainty of significance discussion. As Mother and another relative had breast cancer.   Colonoscopy at 52 education provided. Will  see PCP providers with [facility] to set this up. IUD removal and replacement is pending her [place] levels, and if she needs contraception therapy versus removing IUD and monitoring for periods if [place] levels indicate menopause. Educated on calcium and vitamin D intake. Provided pamphlet on food sources and daily intake recommendations.

## 2016-04-11 ENCOUNTER — Telehealth: Payer: Self-pay | Admitting: *Deleted

## 2016-04-11 DIAGNOSIS — N390 Urinary tract infection, site not specified: Secondary | ICD-10-CM

## 2016-04-11 LAB — HEPATITIS C ANTIBODY: HCV Ab: NEGATIVE

## 2016-04-11 LAB — CYTOLOGY - PAP
Diagnosis: NEGATIVE
HPV: NOT DETECTED

## 2016-04-11 LAB — HEPATITIS B SURFACE ANTIGEN: Hepatitis B Surface Ag: NEGATIVE

## 2016-04-11 LAB — RPR

## 2016-04-11 LAB — VITAMIN D 25 HYDROXY (VIT D DEFICIENCY, FRACTURES): VIT D 25 HYDROXY: 45 ng/mL (ref 30–100)

## 2016-04-11 LAB — POCT URINE PREGNANCY: Preg Test, Ur: NEGATIVE

## 2016-04-11 MED ORDER — SULFAMETHOXAZOLE-TRIMETHOPRIM 800-160 MG PO TABS: 1.0000 | ORAL_TABLET | Freq: Two times a day (BID) | ORAL | 0 refills | Status: DC

## 2016-04-11 NOTE — Telephone Encounter (Signed)
See notes

## 2016-04-11 NOTE — Telephone Encounter (Signed)
Pt notified of urine culture results and RX for Bactrim was sent to her pharmacy per Dr Penne LashLeggett.  Her FSH is elevated and pt is in menopause.  She can have the IUD removed and not replaced.  Her other labs were reviewed with pt and they were released to my chart.

## 2016-04-12 LAB — CERVICOVAGINAL ANCILLARY ONLY
Bacterial vaginitis: NEGATIVE
Candida vaginitis: NEGATIVE
Chlamydia: NEGATIVE
NEISSERIA GONORRHEA: NEGATIVE
Trichomonas: NEGATIVE

## 2016-04-12 LAB — CULTURE, URINE COMPREHENSIVE

## 2016-04-13 ENCOUNTER — Other Ambulatory Visit: Payer: Self-pay | Admitting: Obstetrics & Gynecology

## 2016-04-13 LAB — HIV ANTIBODY (ROUTINE TESTING W REFLEX): HIV 1&2 Ab, 4th Generation: NONREACTIVE

## 2016-04-13 MED ORDER — CIPROFLOXACIN HCL 500 MG PO TABS: 500.0000 mg | ORAL_TABLET | Freq: Two times a day (BID) | ORAL | 0 refills | Status: DC

## 2016-04-13 NOTE — Progress Notes (Signed)
UTI not sensitive to Bactrim.  [facility]

## 2016-04-15 ENCOUNTER — Telehealth: Payer: Self-pay | Admitting: *Deleted

## 2016-04-15 NOTE — Telephone Encounter (Signed)
-----   Message from Lesly DukesKelly H Leggett, MD sent at 04/12/2016 12:18 PM EDT ----- Pt was prescribed bactrim yesterday

## 2016-04-30 ENCOUNTER — Encounter: Payer: Self-pay | Admitting: *Deleted

## 2016-07-12 ENCOUNTER — Emergency Department (INDEPENDENT_AMBULATORY_CARE_PROVIDER_SITE_OTHER): Payer: BLUE CROSS/BLUE SHIELD

## 2016-07-12 ENCOUNTER — Emergency Department
Admission: EM | Admit: 2016-07-12 | Discharge: 2016-07-12 | Disposition: A | Discharge status: Home / Self Care | Payer: BLUE CROSS/BLUE SHIELD | Source: Home / Self Care | Attending: Family Medicine | Admitting: Family Medicine

## 2016-07-12 ENCOUNTER — Encounter: Payer: Self-pay | Admitting: Emergency Medicine

## 2016-07-12 DIAGNOSIS — S93401A Sprain of unspecified ligament of right ankle, initial encounter: Secondary | ICD-10-CM | POA: Diagnosis not present

## 2016-07-12 DIAGNOSIS — M25571 Pain in right ankle and joints of right foot: Secondary | ICD-10-CM | POA: Diagnosis not present

## 2016-07-12 NOTE — ED Triage Notes (Signed)
Rt ankle injury yesterday, twisted while working out, Bruised, swollen, painful.

## 2016-07-12 NOTE — ED Provider Notes (Signed)
[facility]    CSN: [id number] Arrival date & time: 07/12/16  4098     History   Chief Complaint Chief Complaint  Patient presents with  . Ankle Injury    HPI Teresa Lewis is a 51 y.o. female.   Patient states that she inverted her right ankle yesterday while exercising, and has had persistent pain/swelling over her lateral malleolus.   The history is provided by the patient.  Ankle Injury  This is a new problem. The current episode started yesterday. The problem occurs constantly. The problem has not changed since onset.The symptoms are aggravated by walking and standing. The symptoms are relieved by position. Treatments tried: ice pack and ibuprofen. The treatment provided mild relief.    History reviewed. No pertinent past medical history.  Patient Active Problem List   Diagnosis Date Noted  . ASCUS with positive high risk HPV 04/07/2015  . Painful lumpy breasts 04/03/2015  . Essential (primary) hypertension 03/24/2013  . IUD (intrauterine device) in place 09/28/2011  . Family history of breast cancer in mother 09/12/2011    Past Surgical History:  Procedure Laterality Date  . BREAST BIOPSY Left 04/01/2012   [place] core  benign    OB History    Gravida Para Term Preterm AB Living   2 2 2     2    SAB TAB Ectopic Multiple Live Births                   Home Medications    Prior to Admission medications   Medication Sig Start Date End Date Taking? Authorizing Provider  hydrochlorothiazide (MICROZIDE) 12.5 MG capsule TAKE ONE CAPSULE (12.5 MG TOTAL) BY MOUTH EVERY MORNING. 12/24/15   [provider]  ibuprofen (ADVIL,MOTRIN) 200 MG tablet Take 200 mg by mouth every 6 (six) hours as needed.    [provider]  levonorgestrel (MIRENA) 20 MCG/24HR IUD 1 each by Intrauterine route once.    [provider]    Family History Family History  Problem Relation Age of Onset  . Cancer Mother 70       Breast Cancer  . Breast  cancer Mother   . Cancer Father        leukemia  . Diabetes Father   . Hypertension Father   . Clotting disorder Father   . Drug abuse Maternal Grandmother   . Drug abuse Maternal Grandfather   . Drug abuse Paternal Grandmother   . Drug abuse Paternal Grandfather     Social History Social History  Substance Use Topics  . Smoking status: Former Games developer  . Smokeless tobacco: Never Used  . Alcohol use No     Allergies   Patient has no known allergies.   Review of Systems Review of Systems  All other systems reviewed and are negative.    Physical Exam Triage Vital Signs ED Triage Vitals  Enc Vitals Group     BP 07/12/16 0947 (!) 156/81     Pulse Rate 07/12/16 0947 66     Resp --      Temp 07/12/16 0947 97.7 F (36.5 C)     Temp Source 07/12/16 0947 Oral     SpO2 07/12/16 0947 100 %     Weight 07/12/16 0948 155 lb (70.3 kg)     Height 07/12/16 0948 5\' 4"  (1.626 m)     Head Circumference --      Peak Flow --      Pain Score 07/12/16 0948 9  Pain Loc --      Pain Edu? --      Excl. in GC? --    No data found.   Updated Vital Signs BP (!) 156/81 (BP Location: Left Arm)   Pulse 66   Temp 97.7 F (36.5 C) (Oral)   Ht 5\' 4"  (1.626 m)   Wt 155 lb (70.3 kg)   SpO2 100%   BMI 26.61 kg/m   Visual Acuity Right Eye Distance:   Left Eye Distance:   Bilateral Distance:    Right Eye Near:   Left Eye Near:    Bilateral Near:     Physical Exam  Constitutional: She appears well-developed and well-nourished. No distress.  HENT:  Head: Atraumatic.  Eyes: Pupils are equal, round, and reactive to light.  Cardiovascular: Normal rate.   Pulmonary/Chest: Effort normal.  Musculoskeletal:       Right ankle: She exhibits decreased range of motion and swelling. She exhibits no ecchymosis, no deformity, no laceration and normal pulse. Tenderness. Lateral malleolus and AITFL tenderness found. No head of 5th metatarsal and no proximal fibula tenderness found. Achilles  tendon normal.       Feet:  Right ankle:  Decreased range of motion.  Tenderness and swelling over the lateral malleolus.  Joint stable.  No tenderness over the base of the fifth metatarsal.  Distal neurovascular function is intact.   Neurological: She is alert.  Skin: Skin is warm and dry.  Nursing note and vitals reviewed.    UC Treatments / Results  Labs (all labs ordered are listed, but only abnormal results are displayed) Labs Reviewed - No data to display  EKG  EKG Interpretation None       Radiology Dg Ankle Complete Right  Result Date: 07/12/2016 CLINICAL DATA:  Right ankle pain after injury yesterday. EXAM: RIGHT ANKLE - COMPLETE 3+ VIEW COMPARISON:  None. FINDINGS: There is no evidence of fracture, dislocation, or joint effusion. There is no evidence of arthropathy or other focal bone abnormality. Soft tissue swelling is seen over the lateral malleolus suggesting ligamentous injury. IMPRESSION: No fracture or dislocation is noted. Soft tissue swelling is seen over the lateral malleolus suggesting ligamentous injury. Electronically Signed   By: Lupita RaiderJames  Green Jr, M.D.   On: 07/12/2016 10:09    Procedures Procedures (including critical care time)  Medications Ordered in UC Medications - No data to display   Initial Impression / Assessment and Plan / UC Course  I have reviewed the triage vital signs and the nursing notes.  Pertinent labs & imaging results that were available during my care of the patient were reviewed by me and considered in my medical decision making (see chart for details).    Ace Wrap applied.  Dispensed crutches and AirCast stirrup splint. Apply ice pack for 30 minutes every 1 to 2 hours today and tomorrow.  Elevate.  Use crutches for 3 to 5 days.  Wear Ace wrap until swelling decreases.  Wear AirCast brace for about 2 to 3 weeks.  Begin range of motion and stretching exercises in about 5 days as per instruction sheet.  Continue Ibuprofen 200mg , 3  or 4 tabs every 8 hours with food.  Followup with Dr. Rodney Langtonhomas Thekkekandam or Dr. Clementeen GrahamEvan Corey ([facility]) if not improving about two weeks.     Final Clinical Impressions(s) / UC Diagnoses   Final diagnoses:  Sprain of right ankle, unspecified ligament, initial encounter    New Prescriptions New Prescriptions   No  medications on file     Lattie Haw, MD 07/12/16 1035

## 2016-07-12 NOTE — Discharge Instructions (Signed)
Apply ice pack for 30 minutes every 1 to 2 hours today and tomorrow.  Elevate.  Use crutches for 3 to 5 days.  Wear Ace wrap until swelling decreases.  Wear AirCast brace for about 2 to 3 weeks.  Begin range of motion and stretching exercises in about 5 days as per instruction sheet.  Continue Ibuprofen 200mg , 3 or 4 tabs every 8 hours with food.

## 2017-02-04 ENCOUNTER — Other Ambulatory Visit: Payer: Self-pay | Admitting: Obstetrics & Gynecology

## 2017-02-04 DIAGNOSIS — Z1231 Encounter for screening mammogram for malignant neoplasm of breast: Secondary | ICD-10-CM

## 2017-03-07 ENCOUNTER — Ambulatory Visit
Admission: RE | Admit: 2017-03-07 | Discharge: 2017-03-07 | Disposition: A | Discharge status: Home / Self Care | Payer: PRIVATE HEALTH INSURANCE | Source: Ambulatory Visit | Attending: Obstetrics & Gynecology | Admitting: Obstetrics & Gynecology

## 2017-03-07 DIAGNOSIS — Z1231 Encounter for screening mammogram for malignant neoplasm of breast: Secondary | ICD-10-CM

## 2017-03-19 ENCOUNTER — Ambulatory Visit: Payer: Self-pay | Admitting: Obstetrics & Gynecology

## 2017-03-28 DIAGNOSIS — Z8639 Personal history of other endocrine, nutritional and metabolic disease: Secondary | ICD-10-CM | POA: Insufficient documentation

## 2017-04-01 ENCOUNTER — Ambulatory Visit (INDEPENDENT_AMBULATORY_CARE_PROVIDER_SITE_OTHER): Payer: PRIVATE HEALTH INSURANCE | Admitting: Obstetrics and Gynecology

## 2017-04-01 ENCOUNTER — Encounter: Payer: Self-pay | Admitting: Obstetrics and Gynecology

## 2017-04-01 VITALS — BP 155/83 | HR 97 | Resp 16 | Ht 64.0 in | Wt 158.0 lb

## 2017-04-01 DIAGNOSIS — Z1151 Encounter for screening for human papillomavirus (HPV): Secondary | ICD-10-CM | POA: Diagnosis not present

## 2017-04-01 DIAGNOSIS — Z113 Encounter for screening for infections with a predominantly sexual mode of transmission: Secondary | ICD-10-CM

## 2017-04-01 DIAGNOSIS — R102 Pelvic and perineal pain: Secondary | ICD-10-CM | POA: Diagnosis not present

## 2017-04-01 DIAGNOSIS — Z30432 Encounter for removal of intrauterine contraceptive device: Secondary | ICD-10-CM | POA: Diagnosis not present

## 2017-04-01 DIAGNOSIS — Z124 Encounter for screening for malignant neoplasm of cervix: Secondary | ICD-10-CM

## 2017-04-01 MED ORDER — ESTRADIOL 0.5 MG PO TABS: 0.5000 mg | ORAL_TABLET | Freq: Every day | ORAL | 6 refills | Status: DC

## 2017-04-01 NOTE — Progress Notes (Signed)
52 yo here for IUD removal and pap smear. Patient reports experiencing vasomotor symptoms such as hot flushes, night sweats and mood swings. She also reports dyspareunia associated with vaginal dryness. She is recently married and feels that her inability to have intercourse is putting adding stress to her marriage  History reviewed. No pertinent past medical history. Past Surgical History:  Procedure Laterality Date  . BREAST BIOPSY Left 04/01/2012   us core  benign   Family History  Problem Relation Age of Onset  . Cancer Mother 847       Breast Cancer  . Breast cancer Mother   . Cancer Father        leukemia  . Diabetes Father   . Hypertension Father   . Clotting disorder Father   . Drug abuse Maternal Grandmother   . Drug abuse Maternal Grandfather   . Drug abuse Paternal Grandmother   . Drug abuse Paternal Grandfather    Social History   Tobacco Use  . Smoking status: Former Games developermoker  . Smokeless tobacco: Never Used  Substance Use Topics  . Alcohol use: No    Alcohol/week: 0.0 oz  . Drug use: No   ROS See pertinent in HPI  Blood pressure (!) 155/83, pulse 97, resp. rate 16, height 5\' 4"  (1.626 m), weight 158 lb (71.7 kg). GENERAL: Well-developed, well-nourished female in no acute distress.  ABDOMEN: Soft, nontender, nondistended. No organomegaly. PELVIC: Normal external female genitalia. Vagina is pink and rugated.  Normal discharge. Normal appearing cervix with IUD strings extending from os. Uterus is normal in size. No adnexal mass. Diffuse lower abdominal tenderness. EXTREMITIES: No cyanosis, clubbing, or edema, 2+ distal pulses.   A/P 52 yo here for IUD removal and with dyspareunia - pelvic ultrasound ordered to rule out any other etiology of her pelvic pain - Rx estrace provided to help with vasomotor symptoms. Informed patient that goal is to remain on lowest dose possible for the shortest period of time. Patient verbalized understanding - pap smear collected  with cultures - Patient will be contacted with abnormal results  IUD Removal  Patient identified, informed consent performed, consent signed.  Patient was in the dorsal lithotomy position, normal external genitalia was noted.  A speculum was placed in the patient's vagina, normal discharge was noted, no lesions. The cervix was visualized, no lesions, no abnormal discharge.  The strings of the IUD were grasped and pulled using ring forceps. The IUD was removed in its entirety. Patient tolerated the procedure well.

## 2017-04-03 ENCOUNTER — Other Ambulatory Visit: Payer: Self-pay | Admitting: Obstetrics and Gynecology

## 2017-04-03 LAB — CYTOLOGY - PAP
Bacterial vaginitis: POSITIVE — AB
CANDIDA VAGINITIS: NEGATIVE
CHLAMYDIA, DNA PROBE: NEGATIVE
Diagnosis: NEGATIVE
HPV (WINDOPATH): NOT DETECTED
Neisseria Gonorrhea: NEGATIVE
TRICH (WINDOWPATH): NEGATIVE

## 2017-04-03 MED ORDER — METRONIDAZOLE 500 MG PO TABS: 500.0000 mg | ORAL_TABLET | Freq: Two times a day (BID) | ORAL | 0 refills | Status: DC

## 2017-04-04 ENCOUNTER — Ambulatory Visit (INDEPENDENT_AMBULATORY_CARE_PROVIDER_SITE_OTHER): Payer: PRIVATE HEALTH INSURANCE

## 2017-04-04 DIAGNOSIS — R102 Pelvic and perineal pain: Secondary | ICD-10-CM

## 2017-04-17 ENCOUNTER — Encounter: Payer: Self-pay | Admitting: Obstetrics and Gynecology

## 2017-04-17 ENCOUNTER — Telehealth: Payer: Self-pay

## 2017-04-17 ENCOUNTER — Other Ambulatory Visit: Payer: Self-pay | Admitting: Obstetrics and Gynecology

## 2017-04-17 MED ORDER — ESTRADIOL 2 MG PO TABS: 2.0000 mg | ORAL_TABLET | Freq: Every day | ORAL | 6 refills | Status: DC

## 2017-04-17 NOTE — Progress Notes (Unsigned)
Called pt lvm stating original script Estradiol on 04/01/17 was for # 60 tablets which will allow for two tablets per day. Request pt call back if she sees that it was not filled for # 60 at her pharmacy.

## 2017-04-17 NOTE — Telephone Encounter (Signed)
error 

## 2017-04-17 NOTE — Telephone Encounter (Signed)
Pt returned call stating her bottle has # 30 tablets on the label and that was all she was given. Also verified instructions on label of bottle say take one tablet daily. Please advise if script needs to be changed to reflect taking two tablets per day for # 60.

## 2017-04-28 ENCOUNTER — Other Ambulatory Visit: Payer: Self-pay | Admitting: Obstetrics and Gynecology

## 2017-04-28 ENCOUNTER — Encounter: Payer: Self-pay | Admitting: Obstetrics and Gynecology

## 2017-04-29 ENCOUNTER — Ambulatory Visit: Payer: PRIVATE HEALTH INSURANCE | Admitting: Obstetrics and Gynecology

## 2017-04-29 ENCOUNTER — Other Ambulatory Visit (INDEPENDENT_AMBULATORY_CARE_PROVIDER_SITE_OTHER): Payer: PRIVATE HEALTH INSURANCE

## 2017-04-29 DIAGNOSIS — N898 Other specified noninflammatory disorders of vagina: Secondary | ICD-10-CM | POA: Diagnosis not present

## 2017-05-01 LAB — CERVICOVAGINAL ANCILLARY ONLY
Bacterial vaginitis: NEGATIVE
Candida vaginitis: NEGATIVE

## 2017-05-05 NOTE — Telephone Encounter (Signed)
Wet prep neg 04/29/17

## 2017-08-14 ENCOUNTER — Encounter: Payer: Self-pay | Admitting: Obstetrics and Gynecology

## 2017-08-25 ENCOUNTER — Encounter: Payer: Self-pay | Admitting: Advanced Practice Midwife

## 2017-08-25 ENCOUNTER — Ambulatory Visit (INDEPENDENT_AMBULATORY_CARE_PROVIDER_SITE_OTHER): Payer: PRIVATE HEALTH INSURANCE | Admitting: Advanced Practice Midwife

## 2017-08-25 ENCOUNTER — Encounter: Payer: Self-pay | Admitting: *Deleted

## 2017-08-25 VITALS — BP 160/78 | HR 74 | Ht 64.0 in | Wt 162.0 lb

## 2017-08-25 DIAGNOSIS — N939 Abnormal uterine and vaginal bleeding, unspecified: Secondary | ICD-10-CM

## 2017-08-25 DIAGNOSIS — N951 Menopausal and female climacteric states: Secondary | ICD-10-CM

## 2017-08-25 DIAGNOSIS — Z975 Presence of (intrauterine) contraceptive device: Secondary | ICD-10-CM

## 2017-08-25 MED ORDER — MEGESTROL ACETATE 40 MG PO TABS: ORAL_TABLET | ORAL | 3 refills | Status: DC

## 2017-08-25 NOTE — Progress Notes (Signed)
PT had IUD taken out 04/01/17 and has been bleeding ever since. Pt also c/o weight gain. Will retake BP before pt leaves

## 2017-08-25 NOTE — Patient Instructions (Addendum)
Perimenopause Perimenopause is the time when your body begins to move into the menopause (no menstrual period for 12 straight months). It is a natural process. Perimenopause can begin 2-8 years before the menopause and usually lasts for 1 year after the menopause. During this time, your ovaries may or may not produce an egg. The ovaries vary in their production of estrogen and progesterone hormones each month. This can cause irregular menstrual periods, difficulty getting pregnant, vaginal bleeding between periods, and uncomfortable symptoms. What are the causes?  Irregular production of the ovarian hormones, estrogen and progesterone, and not ovulating every month. Other causes include:  Tumor of the pituitary gland in the brain.  Medical disease that affects the ovaries.  Radiation treatment.  Chemotherapy.  Unknown causes.  Heavy smoking and excessive alcohol intake can bring on perimenopause sooner.  What are the signs or symptoms?  Hot flashes.  Night sweats.  Irregular menstrual periods.  Decreased sex drive.  Vaginal dryness.  Headaches.  Mood swings.  Depression.  Memory problems.  Irritability.  Tiredness.  Weight gain.  Trouble getting pregnant.  The beginning of losing bone cells (osteoporosis).  The beginning of hardening of the arteries (atherosclerosis). How is this diagnosed? Your health care provider will make a diagnosis by analyzing your age, menstrual history, and symptoms. He or she will do a physical exam and note any changes in your body, especially your female organs. Female hormone tests may or may not be helpful depending on the amount of female hormones you produce and when you produce them. However, other hormone tests may be helpful to rule out other problems. How is this treated? In some cases, no treatment is needed. The decision on whether treatment is necessary during the perimenopause should be made by you and your health care  provider based on how the symptoms are affecting you and your lifestyle. Various treatments are available, such as:  Treating individual symptoms with a specific medicine for that symptom.  Herbal medicines that can help specific symptoms.  Counseling.  Group therapy.  Follow these instructions at home:  Keep track of your menstrual periods (when they occur, how heavy they are, how long between periods, and how long they last) as well as your symptoms and when they started.  Only take over-the-counter or prescription medicines as directed by your health care provider.  Sleep and rest.  Exercise.  Eat a diet that contains calcium (good for your bones) and soy (acts like the estrogen hormone).  Do not smoke.  Avoid alcoholic beverages.  Take vitamin supplements as recommended by your health care provider. Taking vitamin E may help in certain cases.  Take calcium and vitamin D supplements to help prevent bone loss.  Group therapy is sometimes helpful.  Acupuncture may help in some cases. Contact a health care provider if:  You have questions about any symptoms you are having.  You need a referral to a specialist (gynecologist, psychiatrist, or psychologist). Get help right away if:  You have vaginal bleeding.  Your period lasts longer than 8 days.  Your periods are recurring sooner than 21 days.  You have bleeding after intercourse.  You have severe depression.  You have pain when you urinate.  You have severe headaches.  You have vision problems. This information is not intended to replace advice given to you by your health care provider. Make sure you discuss any questions you have with your health care provider. Document Released: 02/15/2004 Document Revised: 06/15/2015 Document Reviewed: 08/06/2012  Elsevier Interactive Patient Education  2017 [employer]. Abnormal Uterine Bleeding Abnormal uterine bleeding means bleeding more than usual from your uterus.  It can include:  Bleeding between periods.  Bleeding after sex.  Bleeding that is heavier than normal.  Periods that last longer than usual.  Bleeding after you have stopped having your period (menopause).  There are many problems that may cause this. You should see a doctor for any kind of bleeding that is not normal. Treatment depends on the cause of the bleeding. Follow these instructions at home:  Watch your condition for any changes.  Do not use tampons, douche, or have sex, if your doctor tells you not to.  Change your pads often.  Get regular well-woman exams. Make sure they include a pelvic exam and cervical cancer screening.  Keep all follow-up visits as told by your doctor. This is important. Contact a doctor if:  The bleeding lasts more than one week.  You feel dizzy at times.  You feel like you are going to throw up (nauseous).  You throw up. Get help right away if:  You pass out.  You have to change pads every hour.  You have belly (abdominal) pain.  You have a fever.  You get sweaty.  You get weak.  You passing large blood clots from your vagina. Summary  Abnormal uterine bleeding means bleeding more than usual from your uterus.  There are many problems that may cause this. You should see a doctor for any kind of bleeding that is not normal.  Treatment depends on the cause of the bleeding. This information is not intended to replace advice given to you by your health care provider. Make sure you discuss any questions you have with your health care provider. Document Released: 11/04/2008 Document Revised: 01/02/2016 Document Reviewed: 01/02/2016 Elsevier Interactive Patient Education  2017 [employer]

## 2017-08-28 ENCOUNTER — Encounter: Payer: Self-pay | Admitting: Advanced Practice Midwife

## 2017-08-28 DIAGNOSIS — N939 Abnormal uterine and vaginal bleeding, unspecified: Secondary | ICD-10-CM | POA: Insufficient documentation

## 2017-08-28 NOTE — Progress Notes (Signed)
GYNECOLOGY CLINIC ANNUAL PREVENTATIVE CARE ENCOUNTER NOTE  Subjective:   Teresa Lewis is a 52 y.o. [id number] female here for a routine annual gynecologic exam.  Current complaints: bleeding daily since IUD removed in March 2019.  Also has had some weight gain.  Is on estradiol for menopausal symptoms..   Denies discharge, pelvic pain, problems with intercourse or other gynecologic concerns.    Gynecologic History Patient's last menstrual period was 08/26/2014 (approximate). Contraception: condoms Last Pap: 04/01/17. Results were: normal Last mammogram: 03/07/17. Results were: normal  Obstetric History OB History  Gravida Para Term Preterm AB Living  2 2 2     2   SAB TAB Ectopic Multiple Live Births               # Outcome Date GA Lbr Len/2nd Weight Sex Delivery Anes PTL Lv  2 Term      Vag-Spont     1 Term      Vag-Spont       History reviewed. No pertinent past medical history.  Past Surgical History:  Procedure Laterality Date  . BREAST BIOPSY Left 04/01/2012   us core  benign    Current Outpatient Medications on File Prior to Visit  Medication Sig Dispense Refill  . estradiol (ESTRACE) 2 MG tablet Take 1 tablet (2 mg total) by mouth daily. 30 tablet 6  . hydrochlorothiazide (MICROZIDE) 12.5 MG capsule TAKE ONE CAPSULE (12.5 MG TOTAL) BY MOUTH EVERY MORNING.  11  . ibuprofen (ADVIL,MOTRIN) 200 MG tablet Take 200 mg by mouth every 6 (six) hours as needed.    . metroNIDAZOLE (FLAGYL) 500 MG tablet Take 1 tablet (500 mg total) by mouth 2 (two) times daily. (Patient not taking: Reported on 08/25/2017) 14 tablet 0   Current Facility-Administered Medications on File Prior to Visit  Medication Dose Route Frequency Provider Last Rate Last Dose  . levonorgestrel (MIRENA) 20 MCG/24HR IUD   Intrauterine Once Lesly DukesLeggett, Kelly H, MD        No Known Allergies  Social History   Socioeconomic History  . Marital status: Single    Spouse name: Not on file  . Number of children: Not  on file  . Years of education: Not on file  . Highest education level: Not on file  Occupational History  . Occupation: Veterinary surgeonrealtor  Social Needs  . Financial resource strain: Not on file  . Food insecurity:    Worry: Not on file    Inability: Not on file  . Transportation needs:    Medical: Not on file    Non-medical: Not on file  Tobacco Use  . Smoking status: Former Games developermoker  . Smokeless tobacco: Never Used  Substance and Sexual Activity  . Alcohol use: No    Alcohol/week: 0.0 standard drinks  . Drug use: No  . Sexual activity: Yes    Partners: Male  Lifestyle  . Physical activity:    Days per week: Not on file    Minutes per session: Not on file  . Stress: Not on file  Relationships  . Social connections:    Talks on phone: Not on file    Gets together: Not on file    Attends religious service: Not on file    Active member of club or organization: Not on file    Attends meetings of clubs or organizations: Not on file    Relationship status: Not on file  . Intimate partner violence:    Fear of current or ex partner: Not  on file    Emotionally abused: Not on file    Physically abused: Not on file    Forced sexual activity: Not on file  Other Topics Concern  . Not on file  Social History Narrative  . Not on file    Family History  Problem Relation Age of Onset  . Cancer Mother 33       Breast Cancer  . Breast cancer Mother   . Cancer Father        leukemia  . Diabetes Father   . Hypertension Father   . Clotting disorder Father   . Drug abuse Maternal Grandmother   . Drug abuse Maternal Grandfather   . Drug abuse Paternal Grandmother   . Drug abuse Paternal Grandfather     The following portions of the patient's history were reviewed and updated as appropriate: allergies, current medications, past family history, past medical history, past social history, past surgical history and problem list.  Review of Systems Pertinent items are noted in HPI.    Objective:  BP (!) 160/78   Pulse 74   Ht 5\' 4"  (1.626 m)   Wt 73.5 kg   LMP 08/26/2014 (Approximate)   BMI 27.81 kg/m  CONSTITUTIONAL: Well-developed, well-nourished female in no acute distress.  NECK: Normal range of motion, supple, no masses.  Normal thyroid.  SKIN: Skin is warm and dry. No rash noted. Not diaphoretic. No erythema. No pallor. NEUROLGIC: Alert and oriented to person, place, and time. PSYCHIATRIC: Normal mood and affect. Normal behavior. Normal judgment and thought content. CARDIOVASCULAR: Normal heart rate noted, regular rhythm RESPIRATORY: Effort and breath sounds normal, no problems with respiration noted. BREASTS: Symmetric in size. No masses, skin changes, nipple drainage, or lymphadenopathy. ABDOMEN: Soft, normal bowel sounds, no distention noted.  No tenderness, rebound or guarding.  PELVIC: Normal appearing external genitalia; normal appearing vaginal mucosa and cervix.  Light bloody discharge noted.  Normal uterine size, no other palpable masses, no uterine or adnexal tenderness.  Patient given informed consent, signed copy in the chart, time out was performed. Appropriate time out taken. . The patient was placed in the lithotomy position and the cervix brought into view with sterile speculum.  Portio of cervix cleansed x 2 with betadine swabs.  A tenaculum was placed in the anterior lip of the cervix.  The uterus was sounded for depth of 5cm. A pipelle was introduced to into the uterus, suction created,  and an endometrial sample was obtained. All equipment was removed and accounted for.  The patient tolerated the procedure well.  Cramps treated with ibuprofen   Patient given post procedure instructions. The patient will return in 2 weeks for results.   Assessment:  Abnormal Uterine Bleeding since removal of IUD Weight gain Perimenopause Chronic hypertension   Plan:  Will follow up results of endometrial biopsy and manage per MD recommendation. Rx Megace  to control bleeding until she can return for IUD insertion Routine preventative health maintenance measures emphasized. Please refer to After Visit Summary for other counseling recommendations.   Aviva Signs, CNM Nurse Midwife, [facility] and [facility]  Addendum after visit: Endometrium, biopsy - PROLIFERATIVE ENDOMETRIUM WITH BREAKDOWN AND BENIGN ENDOCERVICAL MUCOSA. - NO ENDOMETRIAL HYPERPLASIA, ATYPIA, CERVICAL DYSPLASIA OR MALIGNANCY IDENTIFIED.

## 2017-09-01 ENCOUNTER — Encounter: Payer: Self-pay | Admitting: Certified Nurse Midwife

## 2017-09-01 ENCOUNTER — Ambulatory Visit (INDEPENDENT_AMBULATORY_CARE_PROVIDER_SITE_OTHER): Payer: PRIVATE HEALTH INSURANCE | Admitting: Certified Nurse Midwife

## 2017-09-01 VITALS — BP 142/84 | HR 74 | Ht 64.0 in | Wt 162.0 lb

## 2017-09-01 DIAGNOSIS — N941 Unspecified dyspareunia: Secondary | ICD-10-CM

## 2017-09-01 DIAGNOSIS — Z30430 Encounter for insertion of intrauterine contraceptive device: Secondary | ICD-10-CM | POA: Diagnosis not present

## 2017-09-01 DIAGNOSIS — Z3202 Encounter for pregnancy test, result negative: Secondary | ICD-10-CM | POA: Diagnosis not present

## 2017-09-01 DIAGNOSIS — N952 Postmenopausal atrophic vaginitis: Secondary | ICD-10-CM

## 2017-09-01 LAB — POCT URINE PREGNANCY: Preg Test, Ur: NEGATIVE

## 2017-09-01 MED ORDER — LEVONORGESTREL 19.5 MCG/DAY IU IUD
INTRAUTERINE_SYSTEM | Freq: Once | INTRAUTERINE | Status: AC
  Administered 2017-09-01: 10:00:00 via INTRAUTERINE

## 2017-09-01 MED ORDER — ESTRADIOL 0.1 MG/GM VA CREA: 1.0000 | TOPICAL_CREAM | VAGINAL | 0 refills | Status: DC

## 2017-09-01 MED ORDER — LEVONORGESTREL 19.5 MCG/DAY IU IUD: INTRAUTERINE_SYSTEM | Freq: Once | INTRAUTERINE | Status: DC

## 2017-09-01 NOTE — Progress Notes (Signed)
   GYNECOLOGY OFFICE VISIT NOTE  History:  52 y.o. [id number] here today for EBX results and IUD insertion. VB improved after using Megace. Reports vaginal dryness and painful IC. Desires new IUD placement today.   History reviewed. No pertinent past medical history.  Past Surgical History:  Procedure Laterality Date  . BREAST BIOPSY Left 04/01/2012   us core  benign    The following portions of the patient's history were reviewed and updated as appropriate: allergies, current medications, past family history, past medical history, past social history, past surgical history and problem list.   Health Maintenance:  Normal pap and negative HRHPV on 04/01/17.  EBX on 08/25/17- neg. Normal pelvic US 04/04/17- normal. Normal mammogram on 03/07/17.   Review of Systems:  Pertinent items noted in HPI and remainder of comprehensive ROS otherwise negative. +dyspareunia +VB  Objective:  Physical Exam BP (!) 142/84   Pulse 74   Ht 5\' 4"  (1.626 m)   Wt 73.5 kg   BMI 27.81 kg/m  CONSTITUTIONAL: Well-developed, well-nourished female in no acute distress.  HENT:  Normocephalic, atraumatic.  EYES: Conjunctivae and EOM are normal.  NECK: Normal range of motion, supple, no masses SKIN: Skin is warm and dry. No rash noted. Not diaphoretic. No erythema. No pallor. NEUROLOGIC: Alert and oriented to person, place, and time.  PSYCHIATRIC: Normal mood and affect. Normal behavior. Normal judgment and thought content. CARDIOVASCULAR: Normal heart rate noted RESPIRATORY: Effort normal, no problems with respiration noted   PELVIC: normal external genitalia, atrophic vaginal walls, cervix normal MUSCULOSKELETAL: Normal range of motion. No edema noted.  Labs and Imaging No results found.  Procedure Note: Pt consented for IUD insertion. Pt placed in lithotomy position. Speculum inserted and cervix cleaned with betadine solution. Uterus sounded to 7 cm; Liletta IUD inserted without difficulty. Strings cut at  approximately 3 cm. Speculum removed. Tolerated well.  Assessment & Plan:   1. Encounter for IUD insertion   2. Perimenopausal atrophic vaginitis   3. Dyspareunia, female    Follow up in 1 month for string check Discontinue Megace Rx Estrace cream  Total face-to-face time with patient: 20 minutes  Donette LarryBhambri, Bradin Mcadory, [place] 09/01/2017 10:22 AM

## 2017-09-29 ENCOUNTER — Encounter: Payer: Self-pay | Admitting: Certified Nurse Midwife

## 2017-09-29 ENCOUNTER — Ambulatory Visit (INDEPENDENT_AMBULATORY_CARE_PROVIDER_SITE_OTHER): Payer: PRIVATE HEALTH INSURANCE | Admitting: Certified Nurse Midwife

## 2017-09-29 VITALS — BP 137/80 | Ht 64.0 in | Wt 159.0 lb

## 2017-09-29 DIAGNOSIS — Z30431 Encounter for routine checking of intrauterine contraceptive device: Secondary | ICD-10-CM | POA: Diagnosis not present

## 2017-09-29 DIAGNOSIS — N939 Abnormal uterine and vaginal bleeding, unspecified: Secondary | ICD-10-CM

## 2017-09-29 MED ORDER — DOXYCYCLINE HYCLATE 100 MG PO CAPS: 100.0000 mg | ORAL_CAPSULE | Freq: Two times a day (BID) | ORAL | 0 refills | Status: DC

## 2017-09-29 NOTE — Progress Notes (Signed)
GYNECOLOGY OFFICE VISIT NOTE  History:  52 y.o. [id number] here today for IUD string check. Liletta IUD was placed on 09/01/17 for AUB. She denies any abnormal vaginal discharge or pelvic pain. Having bleeding daily since 2 days after placement. Changing pads q2 hrs. No dyspareunia but only had IC once.   History reviewed. No pertinent past medical history.  Past Surgical History:  Procedure Laterality Date  . BREAST BIOPSY Left 04/01/2012   [place] core  benign   The following portions of the patient's history were reviewed and updated as appropriate: allergies, current medications, past family history, past medical history, past social history, past surgical history and problem list.   Review of Systems:  Pertinent items noted in HPI and remainder of comprehensive ROS otherwise negative. +VB No pelvic pain  Objective:  Physical Exam BP 137/80   Ht 5\' 4"  (1.626 m)   Wt 72.1 kg   BMI 27.29 kg/m  CONSTITUTIONAL: Well-developed, well-nourished female in no acute distress.  HENT:  Normocephalic, atraumatic.  SKIN: Skin is warm and dry. No rash noted. Not diaphoretic. No erythema. No pallor. NEUROLOGIC: Alert and oriented to person, place, and time.  PSYCHIATRIC: Normal mood and affect. Normal behavior. Normal judgment and thought content. CARDIOVASCULAR: Normal heart rate noted RESPIRATORY: Effort and rate normal ABDOMEN: Soft, no distention noted.   PELVIC: Normal appearing external genitalia; normal appearing vaginal mucosa and cervix. IUD string seen and normal length. Small dark blood in vault.   Assessment & Plan:  IUD check up AUB  Start Estrace 2mg  po daily x14 days (has Rx) Doxycycline 100mg  bid x14 days Follow up in 4 weeks  Donette Larry, [place] 09/29/2017 9:14 AM

## 2017-10-03 ENCOUNTER — Other Ambulatory Visit: Payer: Self-pay | Admitting: Certified Nurse Midwife

## 2017-10-03 ENCOUNTER — Encounter: Payer: Self-pay | Admitting: Certified Nurse Midwife

## 2017-10-03 DIAGNOSIS — N952 Postmenopausal atrophic vaginitis: Secondary | ICD-10-CM

## 2017-10-06 ENCOUNTER — Other Ambulatory Visit: Payer: Self-pay | Admitting: *Deleted

## 2017-10-06 MED ORDER — ESTRADIOL 0.1 MG/GM VA CREA: 1.0000 | TOPICAL_CREAM | VAGINAL | 0 refills | Status: DC

## 2017-10-31 ENCOUNTER — Ambulatory Visit: Payer: PRIVATE HEALTH INSURANCE | Admitting: Certified Nurse Midwife

## 2017-11-06 ENCOUNTER — Other Ambulatory Visit: Payer: Self-pay | Admitting: Obstetrics and Gynecology

## 2017-11-14 ENCOUNTER — Ambulatory Visit: Payer: PRIVATE HEALTH INSURANCE | Admitting: Certified Nurse Midwife

## 2017-11-14 DIAGNOSIS — Z09 Encounter for follow-up examination after completed treatment for conditions other than malignant neoplasm: Secondary | ICD-10-CM

## 2017-12-28 ENCOUNTER — Other Ambulatory Visit: Payer: Self-pay

## 2017-12-28 DIAGNOSIS — N952 Postmenopausal atrophic vaginitis: Secondary | ICD-10-CM

## 2017-12-29 MED ORDER — ESTRADIOL 0.1 MG/GM VA CREA: 1.0000 | TOPICAL_CREAM | VAGINAL | 0 refills | Status: DC

## 2018-02-04 ENCOUNTER — Other Ambulatory Visit: Payer: Self-pay | Admitting: Obstetrics & Gynecology

## 2018-02-04 DIAGNOSIS — Z1231 Encounter for screening mammogram for malignant neoplasm of breast: Secondary | ICD-10-CM

## 2018-02-15 ENCOUNTER — Other Ambulatory Visit: Payer: Self-pay

## 2018-02-15 DIAGNOSIS — N952 Postmenopausal atrophic vaginitis: Secondary | ICD-10-CM

## 2018-02-16 MED ORDER — ESTRADIOL 0.1 MG/GM VA CREA: 1.0000 | TOPICAL_CREAM | VAGINAL | 0 refills | Status: DC

## 2018-03-11 ENCOUNTER — Ambulatory Visit
Admission: RE | Admit: 2018-03-11 | Discharge: 2018-03-11 | Disposition: A | Discharge status: Home / Self Care | Payer: PRIVATE HEALTH INSURANCE | Source: Ambulatory Visit | Attending: Obstetrics & Gynecology | Admitting: Obstetrics & Gynecology

## 2018-03-11 DIAGNOSIS — Z1231 Encounter for screening mammogram for malignant neoplasm of breast: Secondary | ICD-10-CM

## 2018-07-08 ENCOUNTER — Other Ambulatory Visit: Payer: Self-pay | Admitting: Certified Nurse Midwife

## 2018-07-13 NOTE — Telephone Encounter (Signed)
doxycycline (VIBRAMYCIN) 100 MG capsule       Sig: Take 1 capsule (100 mg total) by mouth 2 (two) times daily.   Disp:  20 capsule  Refills:  0   Start: 07/08/2018   Class: Normal   Non-formulary   Last ordered: 9 months ago by Julianne Handler, CNM      To be filled at: [id number] - [place], [place]

## 2018-07-14 ENCOUNTER — Telehealth: Payer: Self-pay | Admitting: *Deleted

## 2018-07-14 NOTE — Telephone Encounter (Signed)
Was not able to leave patient a message to reschedule appointment per provider request for patient to see a MD. Mailbox is full.

## 2018-07-14 NOTE — Telephone Encounter (Signed)
-----   Message from Julianne Handler, [place] sent at 07/14/2018  9:40 AM EDT ----- Regarding: RE: Appt She will need to see an MD. She has had bleeding issues for a while and may need further management. I don't think I can help her further with this. Thank you. Melanie ----- Message ----- From: Elmon Else, NT Sent: 07/14/2018   8:21 AM EDT To: Asencion Islam, RN, Tarry Kos, # Subject: RE: Appt                                       Good Morning! Patient is not sure if IUD moved or if she has an infection. Started bleeding about 3.5 weeks now at time of scheduling, per patient. Her main concern is her IUD. Thank you for your time. ----- Message ----- From: Julianne Handler, CNM Sent: 07/14/2018   7:40 AM EDT To: Elmon Else, NT Subject: Appt                                           Kisha, I see this patient is on my schedule but from [facility] I can't tell what for. If it's an annual that is no problem but if it's a problem visit for bleeding then she will need to see an MD. Thanks, Threasa Beards

## 2018-07-15 ENCOUNTER — Ambulatory Visit: Payer: PRIVATE HEALTH INSURANCE | Admitting: Certified Nurse Midwife

## 2018-07-16 ENCOUNTER — Ambulatory Visit (INDEPENDENT_AMBULATORY_CARE_PROVIDER_SITE_OTHER): Payer: PRIVATE HEALTH INSURANCE | Admitting: Obstetrics & Gynecology

## 2018-07-16 ENCOUNTER — Other Ambulatory Visit: Payer: Self-pay

## 2018-07-16 ENCOUNTER — Encounter: Payer: Self-pay | Admitting: Obstetrics & Gynecology

## 2018-07-16 DIAGNOSIS — Z30433 Encounter for removal and reinsertion of intrauterine contraceptive device: Secondary | ICD-10-CM | POA: Diagnosis not present

## 2018-07-16 DIAGNOSIS — N939 Abnormal uterine and vaginal bleeding, unspecified: Secondary | ICD-10-CM

## 2018-07-16 MED ORDER — LEVONORGESTREL 20 MCG/24HR IU IUD
INTRAUTERINE_SYSTEM | Freq: Once | INTRAUTERINE | Status: AC
  Administered 2018-07-16: 15:00:00 via INTRAUTERINE

## 2018-07-16 NOTE — Progress Notes (Signed)
   Subjective:    Patient ID: Janaisha Tolsma, female    DOB: 12/16/1965, 53 y.o.   MRN: [id number]  HPI 53 yo P2 here to have her Liletta removed and replaced with a Mirena. She had the Mirena for 5 years and had no bleeding. It was then replaced with a Liletta 8/19 and she has had bleeding essentially daily since then.    Review of Systems Pap normal 3/19    Objective:   Physical Exam  Breathing, conversing, and ambulating normally Well nourished, well hydrated White female, no apparent distress Bedside u/s confirmed that the Liletta was in the correct position. UPT negative, consent signed, Time out procedure done. Liletta easily removed and noted to be intact Cervix prepped with betadine and grasped with a single tooth tenaculum. Mirena was easily placed and the strings were cut to 3-4 cm. Uterus sounded to 9 cm. She tolerated the procedure well.    Assessment & Plan:  Bleeding with [place] Latino now in place String check in 4 weeks

## 2018-07-16 NOTE — Addendum Note (Signed)
Addended by: Asencion Islam on: 07/16/2018 02:42 PM   Modules accepted: Orders

## 2018-08-10 ENCOUNTER — Other Ambulatory Visit: Payer: Self-pay

## 2018-08-10 ENCOUNTER — Encounter: Payer: Self-pay | Admitting: Obstetrics & Gynecology

## 2018-08-10 ENCOUNTER — Ambulatory Visit (INDEPENDENT_AMBULATORY_CARE_PROVIDER_SITE_OTHER): Payer: PRIVATE HEALTH INSURANCE | Admitting: Obstetrics & Gynecology

## 2018-08-10 VITALS — BP 132/80 | HR 85 | Wt 171.0 lb

## 2018-08-10 DIAGNOSIS — Z30431 Encounter for routine checking of intrauterine contraceptive device: Secondary | ICD-10-CM | POA: Diagnosis not present

## 2018-08-10 NOTE — Progress Notes (Signed)
   Subjective:    Patient ID: Teresa Lewis, female    DOB: January 13, 1966, 53 y.o.   MRN: [id number]  HPI  53 yo married P2 here for a string check. She had a Liletta replaced about 4 weeks ago with a Mirena due to DUB. She has had no bleeding with the Mirena. She is very happy.  Review of Systems Her pap was normal in 2019.    Objective:   Physical Exam Breathing, conversing, and ambulating normally Well nourished, well hydrated White female, no apparent distress Abd- benign Mirena strings seen, about 2 cm long     Assessment & Plan:  Contraception- Mirena She will need a pap in 2 years. At that visit, I'd recommend a [place] level.

## 2018-08-10 NOTE — Progress Notes (Signed)
PT has no complaints with IUD 

## 2018-08-20 ENCOUNTER — Ambulatory Visit: Payer: PRIVATE HEALTH INSURANCE | Admitting: Obstetrics & Gynecology

## 2018-12-06 ENCOUNTER — Other Ambulatory Visit: Payer: Self-pay

## 2018-12-06 DIAGNOSIS — N952 Postmenopausal atrophic vaginitis: Secondary | ICD-10-CM

## 2018-12-09 ENCOUNTER — Other Ambulatory Visit: Payer: Self-pay

## 2018-12-09 DIAGNOSIS — N952 Postmenopausal atrophic vaginitis: Secondary | ICD-10-CM

## 2018-12-19 ENCOUNTER — Other Ambulatory Visit: Payer: Self-pay

## 2018-12-19 DIAGNOSIS — N952 Postmenopausal atrophic vaginitis: Secondary | ICD-10-CM

## 2018-12-28 ENCOUNTER — Other Ambulatory Visit: Payer: Self-pay

## 2018-12-28 DIAGNOSIS — N952 Postmenopausal atrophic vaginitis: Secondary | ICD-10-CM

## 2019-01-29 ENCOUNTER — Other Ambulatory Visit: Payer: Self-pay | Admitting: Obstetrics & Gynecology

## 2019-01-29 DIAGNOSIS — Z1231 Encounter for screening mammogram for malignant neoplasm of breast: Secondary | ICD-10-CM

## 2019-03-15 ENCOUNTER — Other Ambulatory Visit: Payer: Self-pay

## 2019-03-15 ENCOUNTER — Ambulatory Visit
Admission: RE | Admit: 2019-03-15 | Discharge: 2019-03-15 | Disposition: A | Discharge status: Home / Self Care | Payer: PRIVATE HEALTH INSURANCE | Source: Ambulatory Visit | Attending: Obstetrics & Gynecology | Admitting: Obstetrics & Gynecology

## 2019-03-15 DIAGNOSIS — Z1231 Encounter for screening mammogram for malignant neoplasm of breast: Secondary | ICD-10-CM

## 2019-10-03 ENCOUNTER — Emergency Department: Admit: 2019-10-03 | Discharge status: Against Medical Advice | Payer: Self-pay

## 2019-10-07 ENCOUNTER — Other Ambulatory Visit: Payer: Self-pay | Admitting: Obstetrics and Gynecology

## 2019-10-07 ENCOUNTER — Other Ambulatory Visit: Payer: Self-pay

## 2019-10-07 ENCOUNTER — Ambulatory Visit (INDEPENDENT_AMBULATORY_CARE_PROVIDER_SITE_OTHER): Payer: PRIVATE HEALTH INSURANCE | Admitting: Obstetrics and Gynecology

## 2019-10-07 ENCOUNTER — Encounter: Payer: Self-pay | Admitting: Obstetrics and Gynecology

## 2019-10-07 VITALS — BP 131/74 | HR 88 | Ht 64.0 in | Wt 153.0 lb

## 2019-10-07 DIAGNOSIS — R102 Pelvic and perineal pain: Secondary | ICD-10-CM

## 2019-10-07 DIAGNOSIS — L900 Lichen sclerosus et atrophicus: Secondary | ICD-10-CM | POA: Diagnosis not present

## 2019-10-07 DIAGNOSIS — Z30431 Encounter for routine checking of intrauterine contraceptive device: Secondary | ICD-10-CM

## 2019-10-07 DIAGNOSIS — R309 Painful micturition, unspecified: Secondary | ICD-10-CM | POA: Diagnosis not present

## 2019-10-07 MED ORDER — CLOBETASOL PROPIONATE 0.05 % EX OINT: TOPICAL_OINTMENT | CUTANEOUS | 5 refills | Status: DC

## 2019-10-07 NOTE — Progress Notes (Signed)
GYNECOLOGY OFFICE FOLLOW UP NOTE  History:  54 y.o. [id number] here today for follow up for concern for lichen sclerosus on exam [facility]. Pt has long history of vulvar pain, itching. Has tried estradiol, replense, an OTC cream with no relief. Has pain with sex, painful urination, generally uncomfortable in the area.    Has IUD in place for bleeding and happy with it.   History reviewed. No pertinent past medical history.  Past Surgical History:  Procedure Laterality Date  . BREAST BIOPSY Left 04/01/2012   [place] core  benign     Current Outpatient Medications:  .  levonorgestrel (MIRENA) 20 MCG/24HR IUD, 1 each by Intrauterine route once., Disp: , Rfl:  .  losartan (COZAAR) 25 MG tablet, Take 100 mg by mouth. , Disp: , Rfl:  .  clobetasol ointment (TEMOVATE) 0.05 %, Apply to affected area every night for 4 weeks, then every other day for 4 weeks and then twice a week for 4 weeks or until resolution., Disp: 30 g, Rfl: 5 .  doxycycline (VIBRAMYCIN) 100 MG capsule, Take 1 capsule (100 mg total) by mouth 2 (two) times daily. (Patient not taking: Reported on 07/16/2018), Disp: 20 capsule, Rfl: 0 .  estradiol (ESTRACE VAGINAL) 0.1 MG/GM vaginal cream, Place 1 Applicatorful vaginally once a week. Twice a week (Patient not taking: Reported on 07/16/2018), Disp: 42.5 g, Rfl: 0 .  estradiol (ESTRACE) 2 MG tablet, Take 1 tablet (2 mg total) by mouth daily. (Patient not taking: Reported on 07/16/2018), Disp: 30 tablet, Rfl: 6 .  hydrochlorothiazide (MICROZIDE) 12.5 MG capsule, TAKE ONE CAPSULE (12.5 MG TOTAL) BY MOUTH EVERY MORNING. (Patient not taking: Reported on 10/07/2019), Disp: , Rfl: 11 .  ibuprofen (ADVIL,MOTRIN) 200 MG tablet, Take 200 mg by mouth every 6 (six) hours as needed. (Patient not taking: Reported on 10/07/2019), Disp: , Rfl:  .  metroNIDAZOLE (FLAGYL) 500 MG tablet, Take 1 tablet (500 mg total) by mouth 2 (two) times daily. (Patient not taking: Reported on  08/25/2017), Disp: 14 tablet, Rfl: 0  The following portions of the patient's history were reviewed and updated as appropriate: allergies, current medications, past family history, past medical history, past social history, past surgical history and problem list.   Review of Systems:  Pertinent items noted in HPI and remainder of comprehensive ROS otherwise negative.   Objective:  Physical Exam BP 131/74   Pulse 88   Ht 5\' 4"  (1.626 m)   Wt 153 lb (69.4 kg)   BMI 26.26 kg/m  CONSTITUTIONAL: Well-developed, well-nourished female in no acute distress.  HENT:  Normocephalic, atraumatic. External right and left ear normal. Oropharynx is clear and moist EYES: Conjunctivae and EOM are normal. Pupils are equal, round, and reactive to light. No scleral icterus.  NECK: Normal range of motion, supple, no masses SKIN: Skin is warm and dry. No rash noted. Not diaphoretic. No erythema. No pallor. NEUROLOGIC: Alert and oriented to person, place, and time. Normal reflexes, muscle tone coordination. No cranial nerve deficit noted. PSYCHIATRIC: Normal mood and affect. Normal behavior. Normal judgment and thought content. CARDIOVASCULAR: Normal heart rate noted RESPIRATORY: Effort normal, no problems with respiration noted ABDOMEN: Soft, no distention noted.   PELVIC: Normal appearing external genitalia with significantly hypopigmented areas at right and left labia majora near clitoris, labia minora bilaterally, around introitus, particularly at perineum and periurethra; normal appearing vaginal mucosa and cervix.  IUD strings protruding from os. Biopsy done, see note MUSCULOSKELETAL: Normal range  of motion. No edema noted.  Exam done with chaperone present.  Labs and Imaging No results found.  Assessment & Plan:   1. Lichen sclerosus Likely on exam Biopsies done to confirm Reviewed etiology, course, need for topical steroids, pt verbalizes understanding  2. Vulvar pain  3. Painful  urination  4. IUD check up Strings seen in place   Routine preventative health maintenance measures emphasized. Please refer to After Visit Summary for other counseling recommendations.   Return in about 6 weeks (around 11/18/2019) for Followup.  Total face-to-face time with patient: 25 minutes. Over 50% of encounter was spent on counseling and coordination of care.  Baldemar Lenis, M.D. Attending [facility] for [employer] [facility])

## 2019-11-18 ENCOUNTER — Ambulatory Visit (INDEPENDENT_AMBULATORY_CARE_PROVIDER_SITE_OTHER): Payer: PRIVATE HEALTH INSURANCE | Admitting: Obstetrics and Gynecology

## 2019-11-18 ENCOUNTER — Encounter: Payer: Self-pay | Admitting: Obstetrics and Gynecology

## 2019-11-18 ENCOUNTER — Other Ambulatory Visit: Payer: Self-pay

## 2019-11-18 VITALS — BP 145/80 | HR 75 | Ht 64.0 in | Wt 156.0 lb

## 2019-11-18 DIAGNOSIS — L900 Lichen sclerosus et atrophicus: Secondary | ICD-10-CM | POA: Diagnosis not present

## 2019-11-18 NOTE — Progress Notes (Signed)
GYNECOLOGY OFFICE FOLLOW UP NOTE  History:  54 y.o. [id number] here today for follow up for lichen sclerosus. Started steroid cream and has had significant improvement. Had Drue Stager procedure 1.5 weeks after steroid cream started and feels much better. States she has been able to have sex again without pain. Very happy with results.    History reviewed. No pertinent past medical history.  Past Surgical History:  Procedure Laterality Date   BREAST BIOPSY Left 04/01/2012   [place] core  benign     Current Outpatient Medications:    hydrochlorothiazide (MICROZIDE) 12.5 MG capsule, TAKE ONE CAPSULE (12.5 MG TOTAL) BY MOUTH EVERY MORNING., Disp: , Rfl: 11   levonorgestrel (MIRENA) 20 MCG/24HR IUD, 1 each by Intrauterine route once., Disp: , Rfl:    losartan (COZAAR) 25 MG tablet, Take 100 mg by mouth. , Disp: , Rfl:    clobetasol ointment (TEMOVATE) 0.05 %, Apply to affected area every night for 4 weeks, then every other day for 4 weeks and then twice a week for 4 weeks or until resolution. (Patient not taking: Reported on 11/18/2019), Disp: 30 g, Rfl: 5   doxycycline (VIBRAMYCIN) 100 MG capsule, Take 1 capsule (100 mg total) by mouth 2 (two) times daily. (Patient not taking: Reported on 07/16/2018), Disp: 20 capsule, Rfl: 0   estradiol (ESTRACE VAGINAL) 0.1 MG/GM vaginal cream, Place 1 Applicatorful vaginally once a week. Twice a week (Patient not taking: Reported on 07/16/2018), Disp: 42.5 g, Rfl: 0   estradiol (ESTRACE) 2 MG tablet, Take 1 tablet (2 mg total) by mouth daily. (Patient not taking: Reported on 07/16/2018), Disp: 30 tablet, Rfl: 6   ibuprofen (ADVIL,MOTRIN) 200 MG tablet, Take 200 mg by mouth every 6 (six) hours as needed. (Patient not taking: Reported on 10/07/2019), Disp: , Rfl:    metroNIDAZOLE (FLAGYL) 500 MG tablet, Take 1 tablet (500 mg total) by mouth 2 (two) times daily. (Patient not taking: Reported on 08/25/2017), Disp: 14 tablet, Rfl: 0  The following portions of the  patient's history were reviewed and updated as appropriate: allergies, current medications, past family history, past medical history, past social history, past surgical history and problem list.   Review of Systems:  Pertinent items noted in HPI and remainder of comprehensive ROS otherwise negative.   Objective:  Physical Exam BP (!) 145/80    Pulse 75    Ht 5\' 4"  (1.626 m)    Wt 156 lb (70.8 kg)    BMI 26.78 kg/m  CONSTITUTIONAL: Well-developed, well-nourished female in no acute distress.  HENT:  Normocephalic, atraumatic. External right and left ear normal. Oropharynx is clear and moist EYES: Conjunctivae and EOM are normal. Pupils are equal, round, and reactive to light. No scleral icterus.  NECK: Normal range of motion, supple, no masses SKIN: Skin is warm and dry. No rash noted. Not diaphoretic. No erythema. No pallor. NEUROLOGIC: Alert and oriented to person, place, and time. Normal reflexes, muscle tone coordination. No cranial nerve deficit noted. PSYCHIATRIC: Normal mood and affect. Normal behavior. Normal judgment and thought content. CARDIOVASCULAR: Normal heart rate noted RESPIRATORY: Effort normal, no problems with respiration noted ABDOMEN: Soft, no distention noted.   PELVIC: Normal appearing external genitalia, mildly atrophic but significantly improved since last exam, minimal lichen sclerosus noted, normal architecture, white plaque at posterior vagina 1 fingertip extending into vagina MUSCULOSKELETAL: Normal range of motion. No edema noted.  Exam done with chaperone present.  Labs and Imaging No results found.  Assessment & Plan:  1. Lichen sclerosus Reviewed path Cont steroid cream, aim for 2x/ or 1x weekly usage, decrease as tolerated Instructed to put small amount in posterior vagina Verbalizes understanding and in agreement with plan   Routine preventative health maintenance measures emphasized. Please refer to After Visit Summary for other counseling  recommendations.   Return in about 3 months (around 02/18/2020) for Followup.  Total face-to-face time with patient: 15 minutes. Over 50% of encounter was spent on counseling and coordination of care.  Baldemar Lenis, M.D. Attending [facility] [employer] [facility])

## 2019-12-02 ENCOUNTER — Telehealth: Payer: Self-pay | Admitting: *Deleted

## 2019-12-02 NOTE — Telephone Encounter (Signed)
Patient will call back to schedule 3 month F/U appointment with Dr. Earlene Plater for around 02/18/2020.

## 2020-02-18 ENCOUNTER — Other Ambulatory Visit: Payer: Self-pay | Admitting: Obstetrics & Gynecology

## 2020-02-18 DIAGNOSIS — Z1231 Encounter for screening mammogram for malignant neoplasm of breast: Secondary | ICD-10-CM

## 2020-02-24 ENCOUNTER — Telehealth: Payer: Self-pay | Admitting: *Deleted

## 2020-02-24 NOTE — Telephone Encounter (Signed)
Left patient a message to call and schedule F/U appointment with Dr. Earlene Plater.

## 2020-03-15 DIAGNOSIS — Z1231 Encounter for screening mammogram for malignant neoplasm of breast: Secondary | ICD-10-CM

## 2020-05-11 ENCOUNTER — Ambulatory Visit
Admission: RE | Admit: 2020-05-11 | Discharge: 2020-05-11 | Disposition: A | Discharge status: Home / Self Care | Payer: PRIVATE HEALTH INSURANCE | Source: Ambulatory Visit | Attending: Obstetrics & Gynecology | Admitting: Obstetrics & Gynecology

## 2020-05-11 ENCOUNTER — Ambulatory Visit: Payer: PRIVATE HEALTH INSURANCE

## 2020-05-11 ENCOUNTER — Other Ambulatory Visit: Payer: Self-pay

## 2020-05-11 DIAGNOSIS — Z1231 Encounter for screening mammogram for malignant neoplasm of breast: Secondary | ICD-10-CM

## 2021-01-02 ENCOUNTER — Telehealth: Payer: Self-pay | Admitting: Emergency Medicine

## 2021-01-02 NOTE — Telephone Encounter (Signed)
Pt has an appointment on 01/03/21 for left sided pain & fever. Message left - RN requested pt come in today if possible for evaluation based on reason for visit - RN also instructed pt to go to ED tonight if pain is unbearable or she spikes a fever that does not resolve. [facility] # left for call back

## 2021-01-03 ENCOUNTER — Emergency Department (INDEPENDENT_AMBULATORY_CARE_PROVIDER_SITE_OTHER)
Admission: RE | Admit: 2021-01-03 | Discharge: 2021-01-03 | Disposition: A | Discharge status: Home / Self Care | Payer: 59 | Source: Ambulatory Visit | Attending: Family Medicine | Admitting: Family Medicine

## 2021-01-03 ENCOUNTER — Other Ambulatory Visit: Payer: Self-pay

## 2021-01-03 VITALS — BP 109/72 | HR 103 | Temp 99.3°F | Resp 18 | Ht 64.0 in | Wt 148.0 lb

## 2021-01-03 DIAGNOSIS — R109 Unspecified abdominal pain: Secondary | ICD-10-CM | POA: Diagnosis not present

## 2021-01-03 DIAGNOSIS — N10 Acute tubulo-interstitial nephritis: Secondary | ICD-10-CM

## 2021-01-03 HISTORY — DX: Essential (primary) hypertension: I10

## 2021-01-03 LAB — POCT URINALYSIS DIP (MANUAL ENTRY)
Bilirubin, UA: NEGATIVE
Glucose, UA: NEGATIVE mg/dL
Ketones, POC UA: NEGATIVE mg/dL
Nitrite, UA: NEGATIVE
Protein Ur, POC: NEGATIVE mg/dL
Spec Grav, UA: 1.015 (ref 1.010–1.025)
Urobilinogen, UA: 1 E.U./dL
pH, UA: 6 (ref 5.0–8.0)

## 2021-01-03 MED ORDER — CEFTRIAXONE SODIUM 1 G IJ SOLR
1000.0000 mg | Freq: Once | INTRAMUSCULAR | Status: AC
  Administered 2021-01-03: 11:00:00 1000 mg via INTRAMUSCULAR

## 2021-01-03 MED ORDER — IBUPROFEN 800 MG PO TABS: 800.0000 mg | ORAL_TABLET | Freq: Three times a day (TID) | ORAL | 0 refills | Status: AC

## 2021-01-03 MED ORDER — ONDANSETRON HCL 8 MG PO TABS: 8.0000 mg | ORAL_TABLET | Freq: Three times a day (TID) | ORAL | 0 refills | Status: AC | PRN

## 2021-01-03 MED ORDER — CIPROFLOXACIN HCL 500 MG PO TABS: 500.0000 mg | ORAL_TABLET | Freq: Two times a day (BID) | ORAL | 0 refills | Status: AC

## 2021-01-03 NOTE — ED Provider Notes (Signed)
[facility]    CSN: [id number] Arrival date & time: 01/03/21  0945      History   Chief Complaint Chief Complaint  Patient presents with   Abdominal Pain    HPI Teresa Lewis is a 55 y.o. female.   HPI  Patient has had left mid lower abdominal pain for 4 days.  She has had fever up to 103.  She states that she has been taking ibuprofen and Tylenol around-the-clock.  In spite of that she still spiking fevers.  She has had nausea but no vomiting.  Feels like she can eat.  No diarrhea.  No urinary symptoms or hematuria.  She does have a history of kidney stones, does have a history of kidney infections.  Has never had a colonoscopy.  No history of colitis, diverticulitis, irritable bowel syndrome.  No one else at home is sick.  No respiratory symptoms  Past Medical History:  Diagnosis Date   Hypertension     Patient Active Problem List   Diagnosis Date Noted   Abnormal uterine bleeding (AUB) 08/28/2017   History of Graves' disease 03/28/2017   ASCUS with positive high risk HPV 04/07/2015   Painful lumpy breasts 04/03/2015   Essential hypertension 03/24/2013   IUD (intrauterine device) in place 09/28/2011   Family history of breast cancer in mother 09/12/2011    Past Surgical History:  Procedure Laterality Date   BREAST BIOPSY Left 04/01/2012   [place] core  benign    OB History     Gravida  2   Para  2   Term  2   Preterm      AB      Living  2      SAB      IAB      Ectopic      Multiple      Live Births               Home Medications    Prior to Admission medications   Medication Sig Start Date End Date Taking? Authorizing Provider  ciprofloxacin (CIPRO) 500 MG tablet Take 1 tablet (500 mg total) by mouth 2 (two) times daily. 01/03/21  Yes Raylene Everts, MD  ibuprofen (ADVIL) 800 MG tablet Take 1 tablet (800 mg total) by mouth 3 (three) times daily. 01/03/21  Yes Raylene Everts, MD  levonorgestrel [facility]) 20  MCG/24HR IUD 1 each by Intrauterine route once.   Yes [provider]  losartan (COZAAR) 25 MG tablet Take 100 mg by mouth.  06/16/18  Yes [provider]  ondansetron (ZOFRAN) 8 MG tablet Take 1 tablet (8 mg total) by mouth every 8 (eight) hours as needed for nausea or vomiting. 01/03/21  Yes Raylene Everts, MD  hydrochlorothiazide (HYDRODIURIL) 12.5 MG tablet Take 12.5 mg by mouth every morning. 08/26/20   [provider]    Family History Family History  Problem Relation Age of Onset   Cancer Mother 60       Breast Cancer   Breast cancer Mother    Cancer Father        leukemia   Diabetes Father    Hypertension Father    Clotting disorder Father    Drug abuse Maternal Grandmother    Drug abuse Maternal Grandfather    Drug abuse Paternal Grandmother    Drug abuse Paternal Grandfather     Social History Social History   Tobacco Use   Smoking status: Former  Smokeless tobacco: Never  Vaping Use   Vaping Use: Never used  Substance Use Topics   Alcohol use: No    Alcohol/week: 0.0 standard drinks   Drug use: No     Allergies   Patient has no known allergies.   Review of Systems Review of Systems See HPI  Physical Exam Triage Vital Signs ED Triage Vitals  Enc Vitals Group     BP 01/03/21 1008 109/72     Pulse Rate 01/03/21 1008 (!) 103     Resp 01/03/21 1008 18     Temp 01/03/21 1008 99.3 F (37.4 C)     Temp Source 01/03/21 1008 Oral     SpO2 01/03/21 1008 97 %     Weight 01/03/21 1005 148 lb (67.1 kg)     Height 01/03/21 1005 5\' 4"  (1.626 m)     Head Circumference --      Peak Flow --      Pain Score 01/03/21 1004 6     Pain Loc --      Pain Edu? --      Excl. in GC? --    No data found.  Updated Vital Signs BP 109/72 (BP Location: Left Arm)    Pulse (!) 103    Temp 99.3 F (37.4 C) (Oral)    Resp 18    Ht 5\' 4"  (1.626 m)    Wt 67.1 kg    SpO2 97%    BMI 25.40 kg/m      Physical Exam Constitutional:      General:  She is not in acute distress.    Appearance: She is well-developed and normal weight.  HENT:     Head: Normocephalic and atraumatic.     Right Ear: Tympanic membrane and ear canal normal.     Left Ear: Tympanic membrane and ear canal normal.     Mouth/Throat:     Mouth: Mucous membranes are dry.  Eyes:     Conjunctiva/sclera: Conjunctivae normal.     Pupils: Pupils are equal, round, and reactive to light.  Cardiovascular:     Rate and Rhythm: Normal rate and regular rhythm.     Heart sounds: Normal heart sounds.  Pulmonary:     Effort: Pulmonary effort is normal. No respiratory distress.     Breath sounds: Normal breath sounds.  Abdominal:     General: There is no distension.     Palpations: Abdomen is soft.     Tenderness: There is abdominal tenderness. There is left CVA tenderness. There is no guarding or rebound.     Comments: Tenderness to palpation in the deep left mid abdomen.  Mild tenderness to left CVA tenderness.  Musculoskeletal:        General: Normal range of motion.     Cervical back: Normal range of motion.  Lymphadenopathy:     Cervical: No cervical adenopathy.  Skin:    General: Skin is warm and dry.  Neurological:     General: No focal deficit present.     Mental Status: She is alert.  Psychiatric:        Mood and Affect: Mood normal.     UC Treatments / Results  Labs (all labs ordered are listed, but only abnormal results are displayed) Labs Reviewed  POCT URINALYSIS DIP (MANUAL ENTRY) - Abnormal; Notable for the following components:      Result Value   Blood, UA moderate (*)    Leukocytes, UA Trace (*)  All other components within normal limits  URINE CULTURE    EKG   Radiology No results found.  Procedures Procedures (including critical care time)  Medications Ordered in UC Medications  cefTRIAXone (ROCEPHIN) injection 1,000 mg (1,000 mg Intramuscular Given 01/03/21 1039)    Initial Impression / Assessment and Plan / UC Course  I  have reviewed the triage vital signs and the nursing notes.  Pertinent labs & imaging results that were available during my care of the patient were reviewed by me and considered in my medical decision making (see chart for details).     Patient has acute pyelonephritis.Temperature to 103.  Nausea.  I feel she is appropriate for outpatient treatment but is advised that she must go to the emergency room if she is worse at any time instead of better, or is unable to keep down her fluids and her antibiotics. Final Clinical Impressions(s) / UC Diagnoses   Final diagnoses:  Acute left flank pain  Acute pyelonephritis     Discharge Instructions      Take Cipro 2 times a day for 10 days Drink lots of water Take Zofran if needed for nausea or vomiting.  It is important to keep down fluids and antibiotics Take ibuprofen or Tylenol for pain and fever Go to the emergency room if you are worse instead of better Make an appointment for follow-up in approximately 2 weeks to recheck your urine test   ED Prescriptions     Medication Sig Dispense Auth. Provider   ciprofloxacin (CIPRO) 500 MG tablet Take 1 tablet (500 mg total) by mouth 2 (two) times daily. 20 tablet Raylene Everts, MD   ondansetron (ZOFRAN) 8 MG tablet Take 1 tablet (8 mg total) by mouth every 8 (eight) hours as needed for nausea or vomiting. 20 tablet Raylene Everts, MD   ibuprofen (ADVIL) 800 MG tablet Take 1 tablet (800 mg total) by mouth 3 (three) times daily. 21 tablet Raylene Everts, MD      PDMP not reviewed this encounter.   Raylene Everts, MD 01/03/21 [phone]

## 2021-01-03 NOTE — ED Triage Notes (Signed)
Pt c/o LLQ abd pain and fever (100-100.5) x 4 days. Denies bowel changes or vomiting.

## 2021-01-03 NOTE — Discharge Instructions (Signed)
Take Cipro 2 times a day for 10 days Drink lots of water Take Zofran if needed for nausea or vomiting.  It is important to keep down fluids and antibiotics Take ibuprofen or Tylenol for pain and fever Go to the emergency room if you are worse instead of better Make an appointment for follow-up in approximately 2 weeks to recheck your urine test

## 2021-01-06 LAB — URINE CULTURE
MICRO NUMBER:: 12756497
SPECIMEN QUALITY:: ADEQUATE

## 2021-02-19 ENCOUNTER — Other Ambulatory Visit: Payer: Self-pay | Admitting: Obstetrics & Gynecology

## 2021-02-19 DIAGNOSIS — Z1231 Encounter for screening mammogram for malignant neoplasm of breast: Secondary | ICD-10-CM

## 2021-05-14 ENCOUNTER — Ambulatory Visit
Admission: RE | Admit: 2021-05-14 | Discharge: 2021-05-14 | Disposition: A | Discharge status: Home / Self Care | Payer: 59 | Source: Ambulatory Visit | Attending: Obstetrics & Gynecology | Admitting: Obstetrics & Gynecology

## 2021-05-14 DIAGNOSIS — Z1231 Encounter for screening mammogram for malignant neoplasm of breast: Secondary | ICD-10-CM

## 2021-06-07 ENCOUNTER — Other Ambulatory Visit: Payer: Self-pay | Admitting: Obstetrics and Gynecology

## 2021-06-16 LAB — EXTERNAL GENERIC LAB PROCEDURE: COLOGUARD: NEGATIVE

## 2022-03-13 ENCOUNTER — Other Ambulatory Visit: Payer: Self-pay | Admitting: Obstetrics & Gynecology

## 2022-03-13 DIAGNOSIS — Z1231 Encounter for screening mammogram for malignant neoplasm of breast: Secondary | ICD-10-CM

## 2022-05-16 ENCOUNTER — Ambulatory Visit
Admission: RE | Admit: 2022-05-16 | Discharge: 2022-05-16 | Disposition: A | Discharge status: Home / Self Care | Payer: 59 | Source: Ambulatory Visit | Attending: Obstetrics & Gynecology | Admitting: Obstetrics & Gynecology

## 2022-05-16 DIAGNOSIS — Z1231 Encounter for screening mammogram for malignant neoplasm of breast: Secondary | ICD-10-CM

## 2022-12-12 IMAGING — MG MM DIGITAL SCREENING BILAT W/ TOMO AND CAD
8 series · 8 of 24 positions shown · non-contrast
Comparison: Previous exam(s).

CLINICAL DATA: Screening.

EXAM:
DIGITAL SCREENING BILATERAL MAMMOGRAM WITH TOMOSYNTHESIS AND CAD
TECHNIQUE: Bilateral screening digital craniocaudal and mediolateral oblique
mammograms were obtained. Bilateral screening digital breast
tomosynthesis was performed. The images were evaluated with
computer-aided detection.

[L MLO synth-2D]
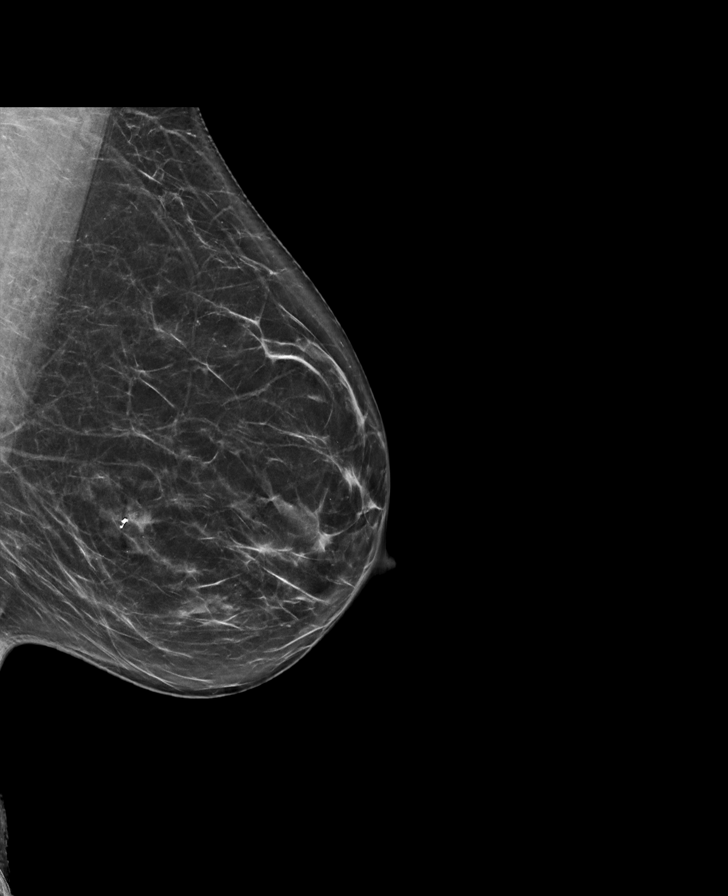

[L CC synth-2D]
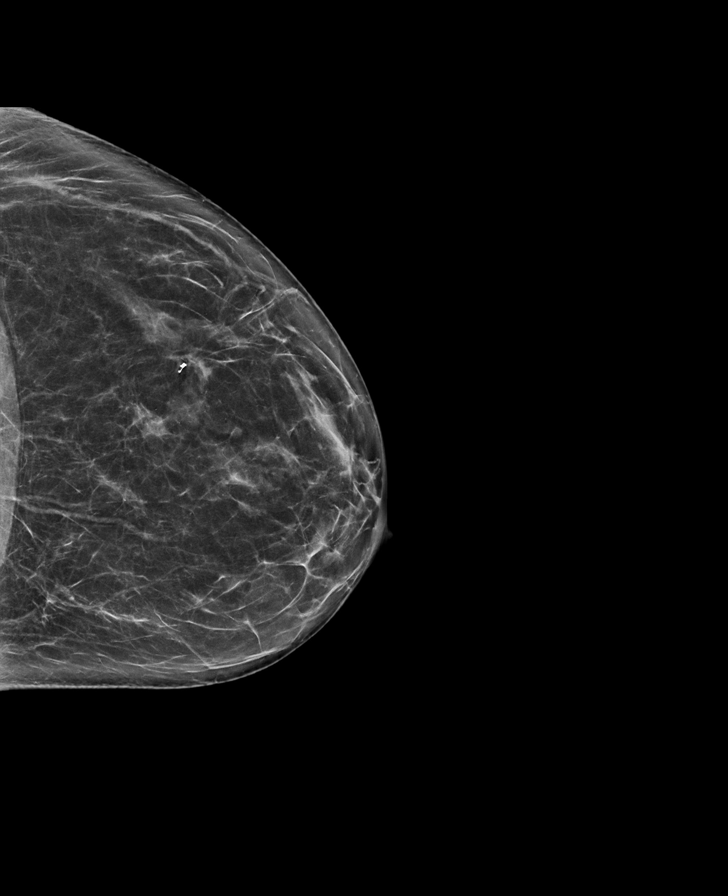

[R MLO synth-2D]
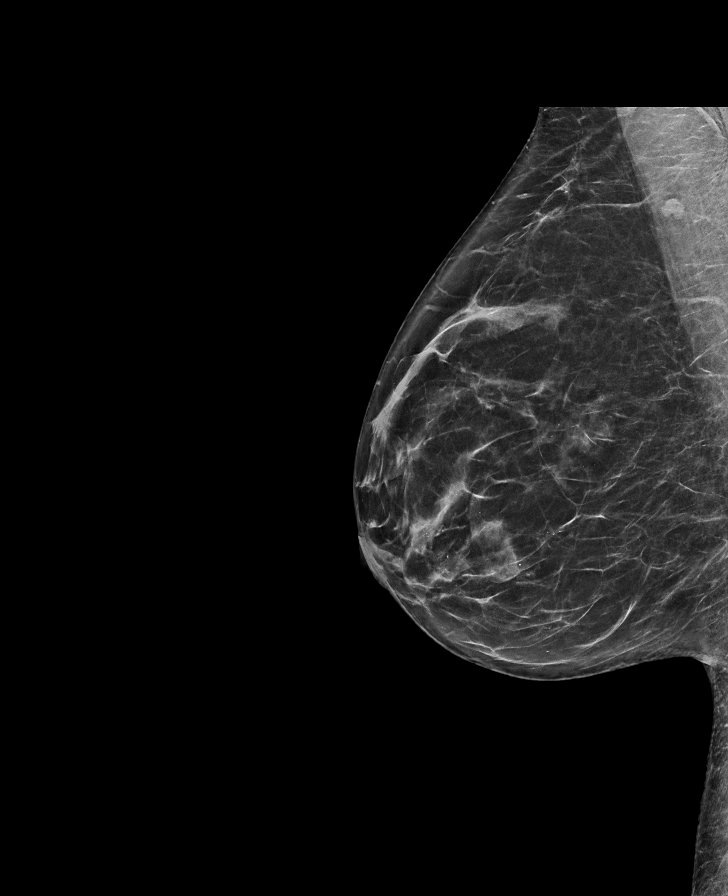

[R CC synth-2D]
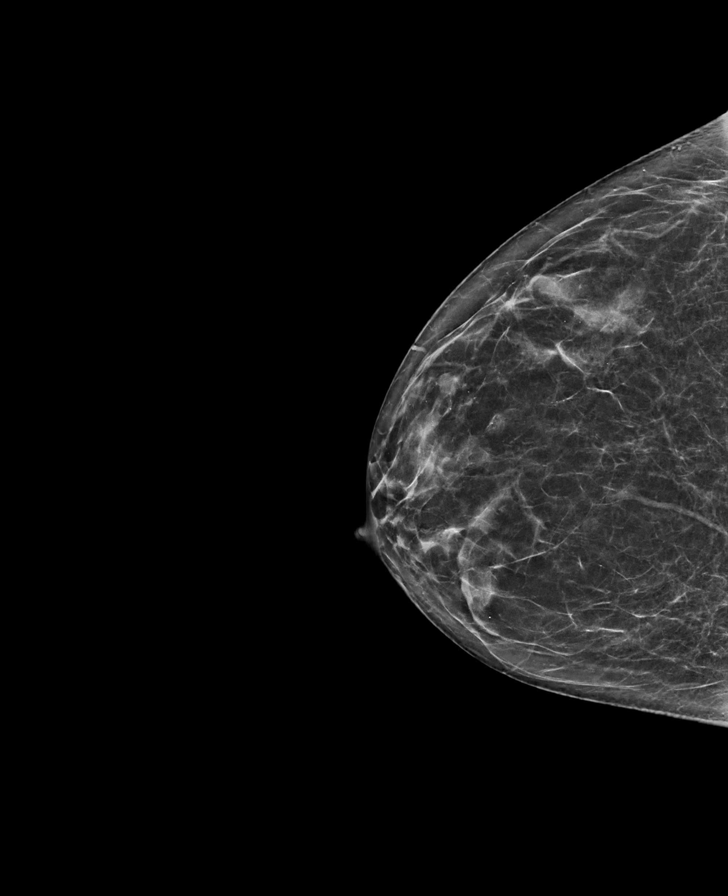

[R CC tomo · tomo slice 33/65.0]
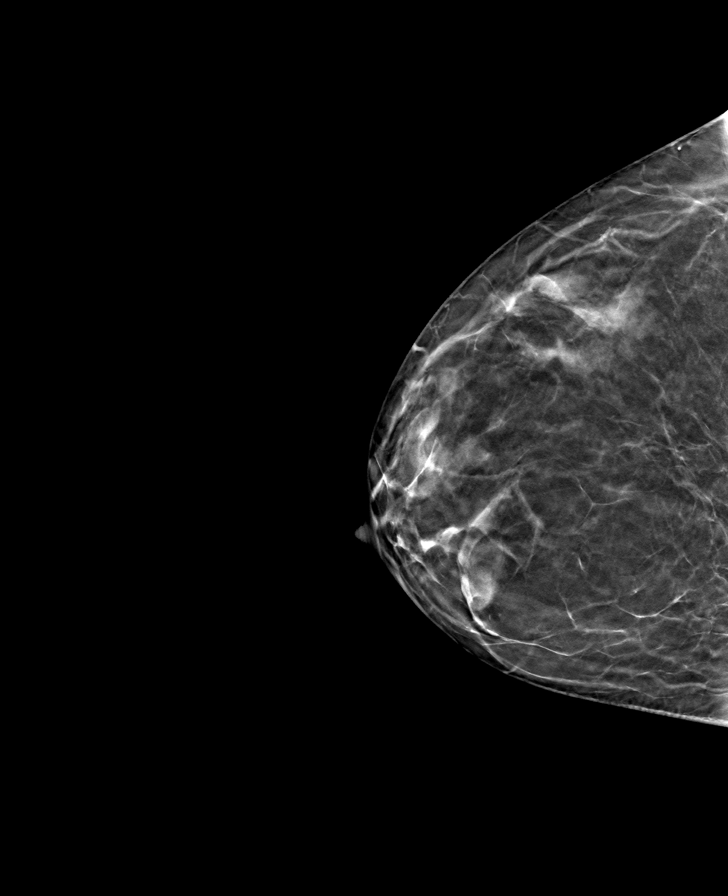

[R MLO tomo · tomo slice 37/72.0]
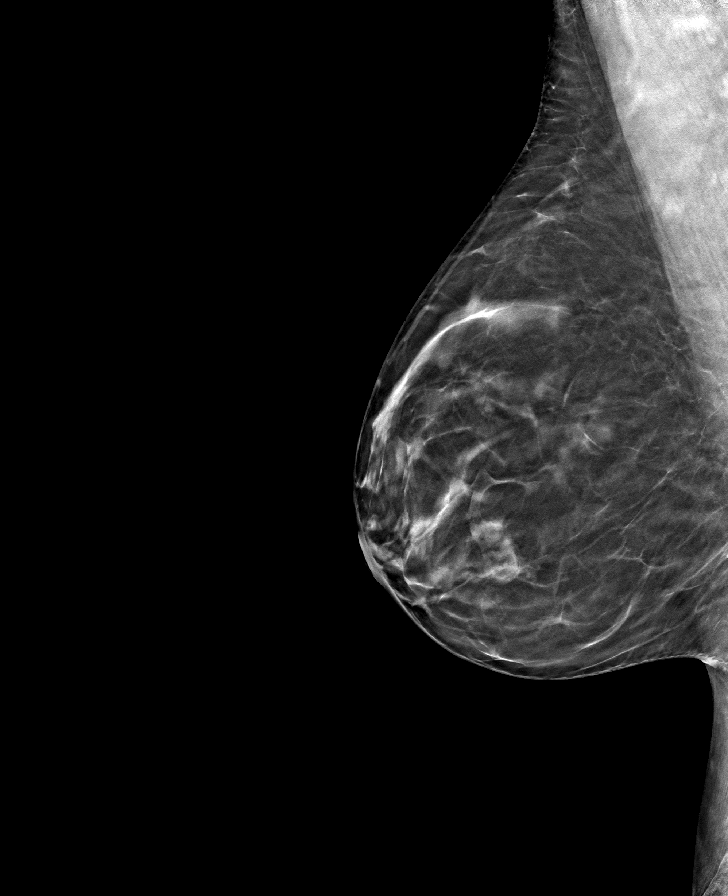

[L MLO tomo · tomo slice 36/71.0]
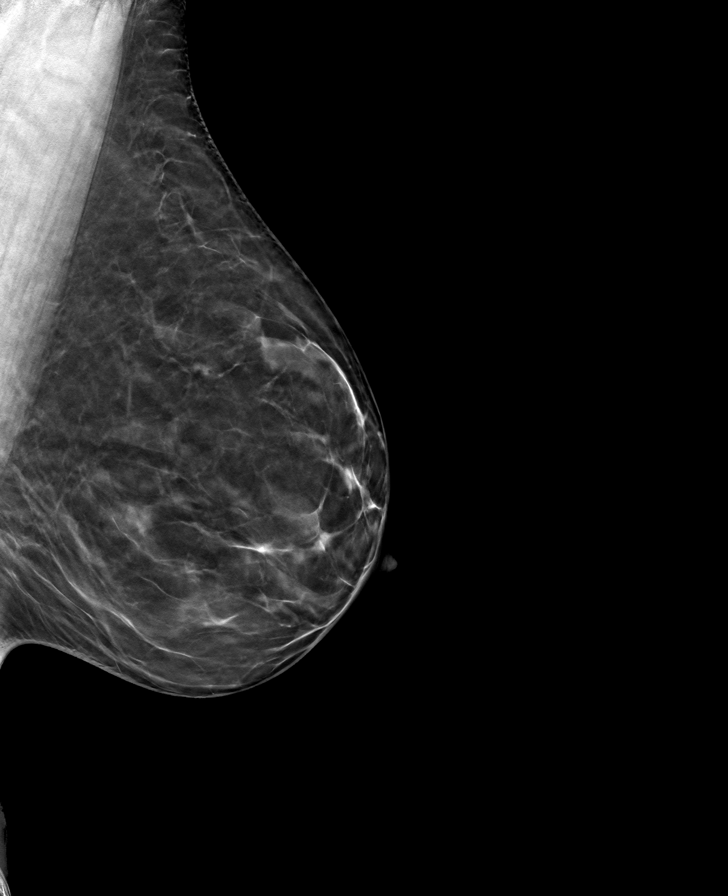

[L CC tomo · tomo slice 37/72.0]
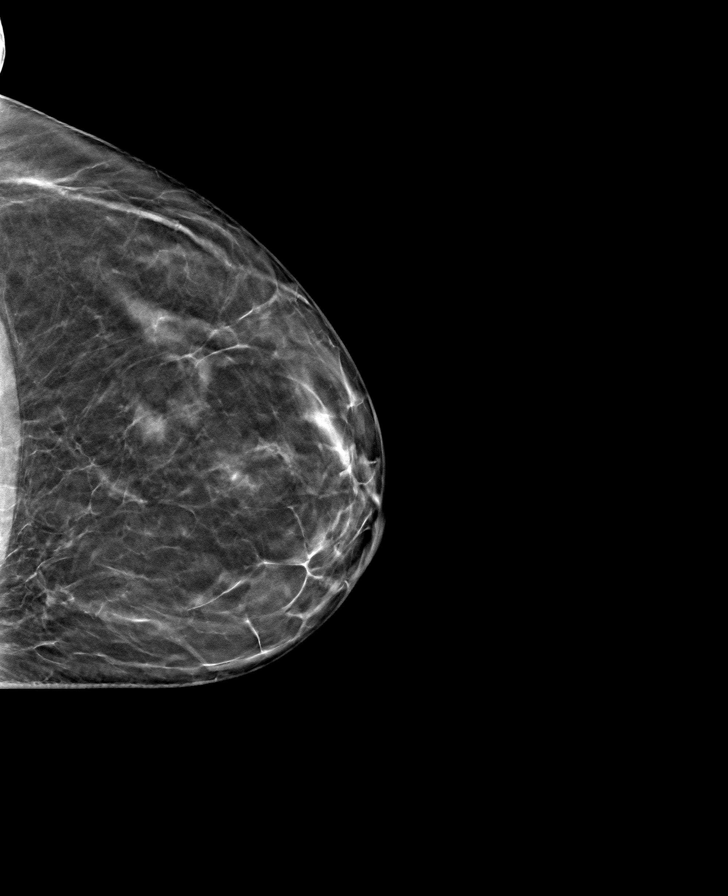

[8 of 24 positions shown; findings below may reference images not displayed]

ACR Breast Density Category b: There are scattered areas of
fibroglandular density.
FINDINGS: There are no findings suspicious for malignancy.
IMPRESSION: No mammographic evidence of malignancy. A result letter of this
screening mammogram will be mailed directly to the patient.

RECOMMENDATION:
Screening mammogram in one year. (Code:51-O-LD2)

BI-RADS CATEGORY  1: Negative.

## 2023-04-24 ENCOUNTER — Other Ambulatory Visit: Payer: Self-pay | Admitting: Obstetrics & Gynecology

## 2023-04-24 DIAGNOSIS — Z1231 Encounter for screening mammogram for malignant neoplasm of breast: Secondary | ICD-10-CM

## 2023-05-20 ENCOUNTER — Ambulatory Visit
Admission: RE | Admit: 2023-05-20 | Discharge: 2023-05-20 | Disposition: A | Discharge status: Home / Self Care | Source: Ambulatory Visit | Attending: Obstetrics & Gynecology | Admitting: Obstetrics & Gynecology

## 2023-05-20 DIAGNOSIS — Z1231 Encounter for screening mammogram for malignant neoplasm of breast: Secondary | ICD-10-CM

## 2023-05-23 ENCOUNTER — Encounter: Payer: Self-pay | Admitting: Obstetrics & Gynecology
